# Patient Record
Sex: Female | Born: 1995 | Race: Asian | Hispanic: No | Marital: Married | Smoking: Never smoker
Health system: Southern US, Community
[De-identification: ages and names within clinical notes are randomized; demographics above are authoritative.]

## PROBLEM LIST (undated history)

## (undated) DIAGNOSIS — B999 Unspecified infectious disease: Secondary | ICD-10-CM

## (undated) DIAGNOSIS — F32A Depression, unspecified: Secondary | ICD-10-CM

## (undated) DIAGNOSIS — J45909 Unspecified asthma, uncomplicated: Secondary | ICD-10-CM

## (undated) HISTORY — PX: NO PAST SURGERIES: SHX2092

## (undated) HISTORY — PX: WISDOM TOOTH EXTRACTION: SHX21

---

## 2016-12-18 LAB — OB RESULTS CONSOLE RPR: RPR: NONREACTIVE

## 2016-12-18 LAB — OB RESULTS CONSOLE ABO/RH: RH Type: POSITIVE

## 2016-12-18 LAB — OB RESULTS CONSOLE RUBELLA ANTIBODY, IGM: RUBELLA: IMMUNE

## 2016-12-18 LAB — OB RESULTS CONSOLE HEPATITIS B SURFACE ANTIGEN: HEP B S AG: NEGATIVE

## 2016-12-18 LAB — OB RESULTS CONSOLE HIV ANTIBODY (ROUTINE TESTING): HIV: NONREACTIVE

## 2016-12-18 LAB — OB RESULTS CONSOLE ANTIBODY SCREEN: Antibody Screen: NEGATIVE

## 2016-12-18 LAB — OB RESULTS CONSOLE GC/CHLAMYDIA: Gonorrhea: NEGATIVE

## 2017-06-28 ENCOUNTER — Inpatient Hospital Stay (HOSPITAL_COMMUNITY)
Admit: 2017-06-28 | Discharge: 2017-07-01 | DRG: 786 | Disposition: A | Discharge status: Home / Self Care | Payer: Managed Care, Other (non HMO) | Source: Ambulatory Visit | Attending: Obstetrics and Gynecology | Admitting: Obstetrics and Gynecology

## 2017-06-28 ENCOUNTER — Encounter (HOSPITAL_COMMUNITY): Disposition: A | Discharge status: Home / Self Care | Payer: Self-pay | Source: Ambulatory Visit

## 2017-06-28 ENCOUNTER — Encounter (HOSPITAL_COMMUNITY): Payer: Self-pay | Admitting: *Deleted

## 2017-06-28 ENCOUNTER — Inpatient Hospital Stay (HOSPITAL_COMMUNITY): Payer: Managed Care, Other (non HMO) | Admitting: Anesthesiology

## 2017-06-28 DIAGNOSIS — O321XX0 Maternal care for breech presentation, not applicable or unspecified: Secondary | ICD-10-CM | POA: Clinically undetermined

## 2017-06-28 DIAGNOSIS — F329 Major depressive disorder, single episode, unspecified: Secondary | ICD-10-CM | POA: Diagnosis present

## 2017-06-28 DIAGNOSIS — Z3A36 36 weeks gestation of pregnancy: Secondary | ICD-10-CM | POA: Diagnosis not present

## 2017-06-28 DIAGNOSIS — F418 Other specified anxiety disorders: Secondary | ICD-10-CM | POA: Diagnosis present

## 2017-06-28 DIAGNOSIS — O42913 Preterm premature rupture of membranes, unspecified as to length of time between rupture and onset of labor, third trimester: Secondary | ICD-10-CM | POA: Diagnosis present

## 2017-06-28 DIAGNOSIS — O99344 Other mental disorders complicating childbirth: Secondary | ICD-10-CM | POA: Diagnosis present

## 2017-06-28 DIAGNOSIS — O42919 Preterm premature rupture of membranes, unspecified as to length of time between rupture and onset of labor, unspecified trimester: Secondary | ICD-10-CM | POA: Diagnosis present

## 2017-06-28 DIAGNOSIS — O9902 Anemia complicating childbirth: Secondary | ICD-10-CM | POA: Diagnosis present

## 2017-06-28 DIAGNOSIS — F419 Anxiety disorder, unspecified: Secondary | ICD-10-CM | POA: Diagnosis present

## 2017-06-28 DIAGNOSIS — O42013 Preterm premature rupture of membranes, onset of labor within 24 hours of rupture, third trimester: Secondary | ICD-10-CM | POA: Diagnosis present

## 2017-06-28 DIAGNOSIS — D649 Anemia, unspecified: Secondary | ICD-10-CM | POA: Diagnosis present

## 2017-06-28 HISTORY — DX: Unspecified asthma, uncomplicated: J45.909

## 2017-06-28 LAB — TYPE AND SCREEN
ABO/RH(D): O POS
Antibody Screen: NEGATIVE

## 2017-06-28 LAB — CBC
HEMATOCRIT: 35 % — AB (ref 36.0–46.0)
HEMOGLOBIN: 11.6 g/dL — AB (ref 12.0–15.0)
MCH: 29.7 pg (ref 26.0–34.0)
MCHC: 33.1 g/dL (ref 30.0–36.0)
MCV: 89.5 fL (ref 78.0–100.0)
Platelets: 161 10*3/uL (ref 150–400)
RBC: 3.91 MIL/uL (ref 3.87–5.11)
RDW: 14 % (ref 11.5–15.5)
WBC: 6.6 10*3/uL (ref 4.0–10.5)

## 2017-06-28 LAB — ABO/RH: ABO/RH(D): O POS

## 2017-06-28 LAB — GROUP B STREP BY PCR: GROUP B STREP BY PCR: NEGATIVE

## 2017-06-28 SURGERY — Surgical Case
Anesthesia: Spinal

## 2017-06-28 MED ORDER — FENTANYL CITRATE (PF) 100 MCG/2ML IJ SOLN
INTRAMUSCULAR | Status: DC | PRN
  Administered 2017-06-28: 20 ug via INTRATHECAL

## 2017-06-28 MED ORDER — LACTATED RINGERS IV SOLN: 500.0000 mL | INTRAVENOUS | Status: DC | PRN

## 2017-06-28 MED ORDER — SCOPOLAMINE 1 MG/3DAYS TD PT72
MEDICATED_PATCH | TRANSDERMAL | Status: AC
  Filled 2017-06-28: qty 1

## 2017-06-28 MED ORDER — ONDANSETRON HCL 4 MG/2ML IJ SOLN: 4.0000 mg | Freq: Four times a day (QID) | INTRAMUSCULAR | Status: DC | PRN

## 2017-06-28 MED ORDER — NALBUPHINE HCL 10 MG/ML IJ SOLN: 5.0000 mg | Freq: Once | INTRAMUSCULAR | Status: AC | PRN

## 2017-06-28 MED ORDER — NALBUPHINE HCL 10 MG/ML IJ SOLN
INTRAMUSCULAR | Status: AC
  Filled 2017-06-28: qty 1

## 2017-06-28 MED ORDER — CEFAZOLIN SODIUM-DEXTROSE 2-4 GM/100ML-% IV SOLN
INTRAVENOUS | Status: AC
  Filled 2017-06-28: qty 100

## 2017-06-28 MED ORDER — LIDOCAINE HCL (PF) 1 % IJ SOLN: 30.0000 mL | INTRAMUSCULAR | Status: DC | PRN

## 2017-06-28 MED ORDER — NALOXONE HCL 4 MG/10ML IJ SOLN
1.0000 ug/kg/h | INTRAVENOUS | Status: DC | PRN
  Filled 2017-06-28: qty 5

## 2017-06-28 MED ORDER — ONDANSETRON HCL 4 MG/2ML IJ SOLN
INTRAMUSCULAR | Status: DC | PRN
  Administered 2017-06-28: 4 mg via INTRAVENOUS

## 2017-06-28 MED ORDER — KETOROLAC TROMETHAMINE 30 MG/ML IJ SOLN: 30.0000 mg | Freq: Once | INTRAMUSCULAR | Status: AC | PRN

## 2017-06-28 MED ORDER — MEPERIDINE HCL 25 MG/ML IJ SOLN: 6.2500 mg | INTRAMUSCULAR | Status: DC | PRN

## 2017-06-28 MED ORDER — LACTATED RINGERS IV SOLN
INTRAVENOUS | Status: DC | PRN
  Administered 2017-06-28 (×2): via INTRAVENOUS

## 2017-06-28 MED ORDER — BUPIVACAINE HCL (PF) 0.25 % IJ SOLN
INTRAMUSCULAR | Status: AC
  Filled 2017-06-28: qty 10

## 2017-06-28 MED ORDER — STERILE WATER FOR IRRIGATION IR SOLN
Status: DC | PRN
  Administered 2017-06-28: 1000 mL

## 2017-06-28 MED ORDER — DEXAMETHASONE SODIUM PHOSPHATE 4 MG/ML IJ SOLN
INTRAMUSCULAR | Status: DC | PRN
  Administered 2017-06-28: 4 mg via INTRAVENOUS

## 2017-06-28 MED ORDER — LACTATED RINGERS IV BOLUS
1000.0000 mL | Freq: Once | INTRAVENOUS | Status: AC
  Administered 2017-06-28: 1000 mL via INTRAVENOUS

## 2017-06-28 MED ORDER — OXYCODONE-ACETAMINOPHEN 5-325 MG PO TABS: 2.0000 | ORAL_TABLET | ORAL | Status: DC | PRN

## 2017-06-28 MED ORDER — NALOXONE HCL 0.4 MG/ML IJ SOLN: 0.4000 mg | INTRAMUSCULAR | Status: DC | PRN

## 2017-06-28 MED ORDER — SCOPOLAMINE 1 MG/3DAYS TD PT72
MEDICATED_PATCH | TRANSDERMAL | Status: DC | PRN
  Administered 2017-06-28: 1 via TRANSDERMAL

## 2017-06-28 MED ORDER — SOD CITRATE-CITRIC ACID 500-334 MG/5ML PO SOLN: 30.0000 mL | Freq: Once | ORAL | Status: DC

## 2017-06-28 MED ORDER — DEXAMETHASONE SODIUM PHOSPHATE 4 MG/ML IJ SOLN
INTRAMUSCULAR | Status: AC
  Filled 2017-06-28: qty 1

## 2017-06-28 MED ORDER — FENTANYL CITRATE (PF) 100 MCG/2ML IJ SOLN
INTRAMUSCULAR | Status: AC
  Filled 2017-06-28: qty 2

## 2017-06-28 MED ORDER — DIPHENHYDRAMINE HCL 25 MG PO CAPS
25.0000 mg | ORAL_CAPSULE | ORAL | Status: DC | PRN
  Filled 2017-06-28: qty 1

## 2017-06-28 MED ORDER — ONDANSETRON HCL 4 MG/2ML IJ SOLN: 4.0000 mg | Freq: Three times a day (TID) | INTRAMUSCULAR | Status: DC | PRN

## 2017-06-28 MED ORDER — BETAMETHASONE SOD PHOS & ACET 6 (3-3) MG/ML IJ SUSP
12.0000 mg | INTRAMUSCULAR | Status: DC
  Administered 2017-06-28: 12 mg via INTRAMUSCULAR
  Filled 2017-06-28: qty 2

## 2017-06-28 MED ORDER — PROMETHAZINE HCL 25 MG/ML IJ SOLN: 6.2500 mg | INTRAMUSCULAR | Status: DC | PRN

## 2017-06-28 MED ORDER — OXYTOCIN 10 UNIT/ML IJ SOLN
INTRAMUSCULAR | Status: AC
  Filled 2017-06-28: qty 1

## 2017-06-28 MED ORDER — DIPHENHYDRAMINE HCL 50 MG/ML IJ SOLN: 12.5000 mg | INTRAMUSCULAR | Status: DC | PRN

## 2017-06-28 MED ORDER — SODIUM CHLORIDE 0.9 % IR SOLN
Status: DC | PRN
  Administered 2017-06-28: 1000 mL

## 2017-06-28 MED ORDER — KETOROLAC TROMETHAMINE 30 MG/ML IJ SOLN: 30.0000 mg | Freq: Four times a day (QID) | INTRAMUSCULAR | Status: AC | PRN

## 2017-06-28 MED ORDER — NALBUPHINE HCL 10 MG/ML IJ SOLN: 5.0000 mg | INTRAMUSCULAR | Status: DC | PRN

## 2017-06-28 MED ORDER — ACETAMINOPHEN 325 MG PO TABS: 650.0000 mg | ORAL_TABLET | ORAL | Status: DC | PRN

## 2017-06-28 MED ORDER — ACETAMINOPHEN 500 MG PO TABS
1000.0000 mg | ORAL_TABLET | Freq: Four times a day (QID) | ORAL | Status: AC
  Administered 2017-06-29 (×3): 1000 mg via ORAL
  Filled 2017-06-28 (×3): qty 2

## 2017-06-28 MED ORDER — SERTRALINE HCL 25 MG PO TABS
25.0000 mg | ORAL_TABLET | Freq: Every day | ORAL | Status: DC
  Administered 2017-06-28: 25 mg via ORAL
  Filled 2017-06-28 (×2): qty 1

## 2017-06-28 MED ORDER — CEFAZOLIN SODIUM-DEXTROSE 2-3 GM-%(50ML) IV SOLR
INTRAVENOUS | Status: DC | PRN
  Administered 2017-06-28: 2 g via INTRAVENOUS

## 2017-06-28 MED ORDER — MORPHINE SULFATE (PF) 0.5 MG/ML IJ SOLN
INTRAMUSCULAR | Status: AC
  Filled 2017-06-28: qty 10

## 2017-06-28 MED ORDER — NALBUPHINE HCL 10 MG/ML IJ SOLN
5.0000 mg | Freq: Once | INTRAMUSCULAR | Status: AC | PRN
  Administered 2017-06-28: 5 mg via INTRAVENOUS

## 2017-06-28 MED ORDER — ONDANSETRON HCL 4 MG/2ML IJ SOLN
INTRAMUSCULAR | Status: AC
  Filled 2017-06-28: qty 2

## 2017-06-28 MED ORDER — BUPIVACAINE IN DEXTROSE 0.75-8.25 % IT SOLN
INTRATHECAL | Status: DC | PRN
  Administered 2017-06-28: 1.4 mL via INTRATHECAL

## 2017-06-28 MED ORDER — OXYTOCIN 10 UNIT/ML IJ SOLN: 10.0000 [IU] | Freq: Once | INTRAMUSCULAR | Status: DC

## 2017-06-28 MED ORDER — NALBUPHINE HCL 10 MG/ML IJ SOLN
5.0000 mg | INTRAMUSCULAR | Status: DC | PRN
  Administered 2017-06-29 (×3): 5 mg via INTRAVENOUS
  Filled 2017-06-28 (×3): qty 1

## 2017-06-28 MED ORDER — OXYTOCIN 40 UNITS IN LACTATED RINGERS INFUSION - SIMPLE MED: 2.5000 [IU]/h | INTRAVENOUS | Status: DC

## 2017-06-28 MED ORDER — MORPHINE SULFATE (PF) 0.5 MG/ML IJ SOLN
INTRAMUSCULAR | Status: DC | PRN
  Administered 2017-06-28: .2 mg via INTRATHECAL

## 2017-06-28 MED ORDER — OXYTOCIN 40 UNITS IN LACTATED RINGERS INFUSION - SIMPLE MED
INTRAVENOUS | Status: AC
  Filled 2017-06-28: qty 1000

## 2017-06-28 MED ORDER — PHENYLEPHRINE 8 MG IN D5W 100 ML (0.08MG/ML) PREMIX OPTIME
INJECTION | INTRAVENOUS | Status: AC
  Filled 2017-06-28: qty 100

## 2017-06-28 MED ORDER — SCOPOLAMINE 1 MG/3DAYS TD PT72: 1.0000 | MEDICATED_PATCH | Freq: Once | TRANSDERMAL | Status: DC

## 2017-06-28 MED ORDER — OXYTOCIN BOLUS FROM INFUSION: 500.0000 mL | Freq: Once | INTRAVENOUS | Status: DC

## 2017-06-28 MED ORDER — HYDROMORPHONE HCL 1 MG/ML IJ SOLN: 0.2500 mg | INTRAMUSCULAR | Status: DC | PRN

## 2017-06-28 MED ORDER — OXYCODONE-ACETAMINOPHEN 5-325 MG PO TABS: 1.0000 | ORAL_TABLET | ORAL | Status: DC | PRN

## 2017-06-28 MED ORDER — SOD CITRATE-CITRIC ACID 500-334 MG/5ML PO SOLN
30.0000 mL | ORAL | Status: DC | PRN
  Filled 2017-06-28: qty 15

## 2017-06-28 MED ORDER — PHENYLEPHRINE 8 MG IN D5W 100 ML (0.08MG/ML) PREMIX OPTIME
INJECTION | INTRAVENOUS | Status: DC | PRN
  Administered 2017-06-28: 60 ug/min via INTRAVENOUS

## 2017-06-28 MED ORDER — SODIUM CHLORIDE 0.9% FLUSH: 3.0000 mL | INTRAVENOUS | Status: DC | PRN

## 2017-06-28 MED ORDER — BUPIVACAINE HCL (PF) 0.25 % IJ SOLN
INTRAMUSCULAR | Status: DC | PRN
  Administered 2017-06-28: 20 mL

## 2017-06-28 SURGICAL SUPPLY — 35 items
CHLORAPREP W/TINT 26ML (MISCELLANEOUS) ×3 IMPLANT
CLAMP CORD UMBIL (MISCELLANEOUS) IMPLANT
CLOTH BEACON ORANGE TIMEOUT ST (SAFETY) ×3 IMPLANT
DERMABOND ADVANCED (GAUZE/BANDAGES/DRESSINGS) ×2
DERMABOND ADVANCED .7 DNX12 (GAUZE/BANDAGES/DRESSINGS) ×1 IMPLANT
DRSG OPSITE POSTOP 4X10 (GAUZE/BANDAGES/DRESSINGS) ×3 IMPLANT
ELECT REM PT RETURN 9FT ADLT (ELECTROSURGICAL) ×3
ELECTRODE REM PT RTRN 9FT ADLT (ELECTROSURGICAL) ×1 IMPLANT
EXTRACTOR VACUUM M CUP 4 TUBE (SUCTIONS) IMPLANT
EXTRACTOR VACUUM M CUP 4' TUBE (SUCTIONS)
GLOVE BIO SURGEON STRL SZ7.5 (GLOVE) ×3 IMPLANT
GLOVE BIOGEL PI IND STRL 7.0 (GLOVE) ×1 IMPLANT
GLOVE BIOGEL PI INDICATOR 7.0 (GLOVE) ×2
GOWN STRL REUS W/TWL LRG LVL3 (GOWN DISPOSABLE) ×6 IMPLANT
KIT ABG SYR 3ML LUER SLIP (SYRINGE) IMPLANT
NEEDLE HYPO 22GX1.5 SAFETY (NEEDLE) ×3 IMPLANT
NEEDLE HYPO 25X5/8 SAFETYGLIDE (NEEDLE) IMPLANT
NEEDLE SPNL 20GX3.5 QUINCKE YW (NEEDLE) IMPLANT
NS IRRIG 1000ML POUR BTL (IV SOLUTION) ×3 IMPLANT
PACK C SECTION WH (CUSTOM PROCEDURE TRAY) ×3 IMPLANT
PENCIL SMOKE EVAC W/HOLSTER (ELECTROSURGICAL) ×3 IMPLANT
SUT MNCRL 0 VIOLET CTX 36 (SUTURE) ×2 IMPLANT
SUT MNCRL AB 3-0 PS2 27 (SUTURE) IMPLANT
SUT MON AB 2-0 CT1 27 (SUTURE) ×3 IMPLANT
SUT MON AB-0 CT1 36 (SUTURE) ×6 IMPLANT
SUT MONOCRYL 0 CTX 36 (SUTURE) ×4
SUT PLAIN 0 NONE (SUTURE) IMPLANT
SUT PLAIN 2 0 (SUTURE)
SUT PLAIN 2 0 XLH (SUTURE) IMPLANT
SUT PLAIN ABS 2-0 CT1 27XMFL (SUTURE) IMPLANT
SUT VIC AB 4-0 KS 27 (SUTURE) ×3 IMPLANT
SYR 20CC LL (SYRINGE) IMPLANT
SYR CONTROL 10ML LL (SYRINGE) ×3 IMPLANT
TOWEL OR 17X24 6PK STRL BLUE (TOWEL DISPOSABLE) ×3 IMPLANT
TRAY FOLEY W/BAG SLVR 14FR LF (SET/KITS/TRAYS/PACK) ×3 IMPLANT

## 2017-06-28 NOTE — Progress Notes (Signed)
S: Contractions stronger and regular this afternoon, some blood on pad noted, continues leaking fluid.  Patient decided she did want steroids injection for fetal lung maturation after all, and received one dose betamethasone this afternoon.   O: VSSAF FHR 140 intermittent, no audible decels Toco: q 5-10 min, mild/mod to palp  SVE 3-4/80/-1, sacrum presenting  Bedside sono - breech confirmed, fetal head in URQ, fluid subjectively normal  A/P: 36.1 wks, PPROM now in labor FHT cat 1 S/p BMZ inj #1 at 1930 Cervical change, progressing in labor Malpresentation  Reviewed risks of breech vaginal birth vs primary cesarean section. Patient desires to proceed with cesarean Risk of C/S reviewed including infection, bleeding, poss need for blood transfusion and its risk,( HIV, hepatitis, fever/rash) injury to surrounding organ structures, internal scar tissue. All questions answered.  Dr. Billy Coast updated.   Neta Mends, MSN, CNM 06/28/2017, 9:15 PM

## 2017-06-28 NOTE — Progress Notes (Signed)
Michelle Vanhise,RN PACU RN 

## 2017-06-28 NOTE — Progress Notes (Signed)
Patient seen and examined. Consent witnessed and signed. No changes noted. Update completed. Confirmed Breech with significant cervical change per CNM Will proceed with csection. Discussed with anesthesia.  Unable to wait appropriate time for NPO status due to Breech and cervical change. Tocolysis not advisable with PPROM and possible risks of infection. Patient ID: Adrienne Calderon, female   DOB: 09/07/1995, 22 y.o.   MRN: [id number]

## 2017-06-28 NOTE — Op Note (Signed)
Cesarean Section Procedure Note  Indications: malpresentation: frank breech, PPROM, active labor  Pre-operative Diagnosis: 36 week 1 day pregnancy.  Post-operative Diagnosis: same  Surgeon: Lenoard Aden   Assistants: Renae Fickle, CNM  Anesthesia: Local anesthesia 0.25.% bupivacaine and Spinal anesthesia  ASA Class: 2  Procedure Details  The patient was seen in the Holding Room. The risks, benefits, complications, treatment options, and expected outcomes were discussed with the patient.  The patient concurred with the proposed plan, giving informed consent. The risks of anesthesia, infection, bleeding and possible injury to other organs discussed. Injury to bowel, bladder, or ureter with possible need for repair discussed. Possible need for transfusion with secondary risks of hepatitis or HIV acquisition discussed. Post operative complications to include but not limited to DVT, PE and Pneumonia noted. The site of surgery properly noted/marked. The patient was taken to Operating Room # 9, identified as Adrienne Calderon and the procedure verified as C-Section Delivery. A Time Out was held and the above information confirmed.  After induction of anesthesia, the patient was draped and prepped in the usual sterile manner. A Pfannenstiel incision was made and carried down through the subcutaneous tissue to the fascia. Fascial incision was made and extended transversely using Mayo scissors. The fascia was separated from the underlying rectus tissue superiorly and inferiorly. The peritoneum was identified and entered. Peritoneal incision was extended longitudinally. The utero-vesical peritoneal reflection was incised transversely and the bladder flap was bluntly freed from the lower uterine segment. A low transverse uterine incision(Kerr hysterotomy) was made. Delivered from frank breech presentation was a  female with Apgar scores of 8 at one minute and 9 at five minutes. Bulb suctioning gently performed.  Neonatal team in attendance.After the umbilical cord was clamped and cut cord blood was obtained for evaluation. The placenta was removed intact and appeared normal. The uterus was curetted with a dry lap pack. Good hemostasis was noted.The uterine outline, tubes and ovaries appeared normal. The uterine incision was closed with running locked sutures of 0 Monocryl x 2 layers. Hemostasis was observed. Lavage was carried out until clear.The parietal peritoneum was closed with a running 2-0 Monocryl suture. The fascia was then reapproximated with running sutures of 0 Monocryl. The skin was reapproximated with 4-0 vicryl after Strawberry closure with 2-0 plain.  Instrument, sponge, and needle counts were correct prior the abdominal closure and at the conclusion of the case.   Findings: As noted , post placenta  Estimated Blood Loss:  500         Drains: foley                 Specimens: placenta                 Complications:  None; patient tolerated the procedure well.         Disposition: PACU - hemodynamically stable.         Condition: stable  Attending Attestation: I performed the procedure.

## 2017-06-28 NOTE — H&P (Signed)
OB ADMISSION/ HISTORY & PHYSICAL:  Admission Date: 06/28/2017  8:24 AM  Admit Diagnosis: Late preterm IUP with PROM at [redacted]w[redacted]d  Adrienne Calderon is a 22 y.o. female presenting for PPROM at [redacted]w[redacted]d. Noted gush of fluid at 2100 06/27/2017. + FM, occasional mild ctx, no fever/chills.   Prenatal History: G1P0   EDC : 07/22/2017, by Other Basis  Prenatal care at [facility] since [redacted] weeks gestation, TOC from [facility] (3 visits) for birth center care. Started prenatal care at [facility] for 1 visit.  Prenatal course complicated by depression, started on Zoloft 2 weeks ago with noted mood improvement, and now PPROM late term.  Prenatal Labs: ABO, Rh: O (10/18 0000)  Antibody: NEG (04/28 0956) Rubella: Immune (10/18 0000)  RPR: Nonreactive (10/18 0000)  HBsAg: Negative (10/18 0000)  HIV: Non-reactive (10/18 0000)  GBS:   unknown 1 hr Glucola : 126 Genetic screens: normal NT/ultrascreen, declined AFP Anatomy US wnl, unknown gender, posterior placenta   Medical / Surgical History :  Past medical history:  Past Medical History:  Diagnosis Date  . Asthma    childhood     Past surgical history:  Past Surgical History:  Procedure Laterality Date  . NO PAST SURGERIES    . WISDOM TOOTH EXTRACTION N/A      Family History:  Family History  Problem Relation Age of Onset  . Diabetes Maternal Grandmother      Social History:  reports that she has never smoked. She has never used smokeless tobacco. She reports that she drank alcohol. She reports that she does not use drugs.   Allergies: Benzoyl peroxide    Current Medications at time of admission:  Medications Prior to Admission  Medication Sig Dispense Refill Last Dose  . Prenatal Vit-Fe Fumarate-FA (PRENATAL MULTIVITAMIN) TABS tablet Take 1 tablet by mouth daily at 12 noon.   Past Week at Unknown time  . sertraline (ZOLOFT) 25 MG tablet Take 25 mg by mouth daily.   06/27/2017 at Unknown time      Review of  Systems: ROS As noted above   Physical Exam:  Exam by:: Renae Fickle, CNM Vitals:   06/28/17 1315 06/28/17 1316  BP:  (!) 103/46  Pulse:  91  Resp: 16   Temp: 98.3 F (36.8 C)   SpO2:      General: AAO x3, NAD Heart:RRR Lungs:CTAB Abdomen:gravid, NT, S=D Extremities:no edema Genitalia / VE:SSE positive for pooling, clear fluid, copious amount, cervix appears closed, patulous FHR: 140, mod var, + accels, no decels TOCO: occasional, mild  Labs:    Recent Labs    06/28/17 0956  WBC 6.6  HGB 11.6*  HCT 35.0*  PLT 161       Assessment:  22 y.o. G1P0 at [redacted]w[redacted]d, PPROM x 12 hrs  1. NIL, afebrile 2. FHR category 1 3. GBS pending, rapid 4. Desires low intervention birth 5. Depression/anxiety, stable on Zoloft 6. Breastfeeding  Plan:  1. Admit to BS 2. Routine L&D orders 3. Analgesia/anesthesia PRN  4. Discussed with patient option for 1) IOL (recommended d/t decreased risk infection/morbidity and no increased risk of cesarean section); 2) Expectant management for labor with option for AN steroids course to aid fetal lung maturation, some data shows benefit for late preterm infant in decreasing respiratory distress at birth. This however, may increase risk for chorio/endometritis. After answering all questions, patient desires to await physiologic labor for total 24 hrs, and agrees to IOL at 2100 tonight if no spontaneous labor  by then. Declines BMZ course.   5. Anticipate NSVB   Dr Billy Coast notified of admission / plan of care   Neta Mends CNM, MSN 06/28/2017, 3:21 PM

## 2017-06-28 NOTE — Transfer of Care (Signed)
Immediate Anesthesia Transfer of Care Note  Patient: Adrienne Calderon  Procedure(s) Performed: CESAREAN SECTION (N/A )  Patient Location: PACU  Anesthesia Type:Spinal  Level of Consciousness: awake  Airway & Oxygen Therapy: Patient Spontanous Breathing  Post-op Assessment: Report given to RN and Post -op Vital signs reviewed and stable  Post vital signs: Reviewed and stable  Last Vitals:  Vitals Value Taken Time  BP 108/88 06/28/2017 10:47 PM  Temp    Pulse 80 06/28/2017 10:50 PM  Resp 16 06/28/2017 10:50 PM  SpO2 99 % 06/28/2017 10:50 PM  Vitals shown include unvalidated device data.  Last Pain:  Vitals:   06/28/17 2011  TempSrc:   PainSc: 3          Complications: No apparent anesthesia complications

## 2017-06-28 NOTE — Anesthesia Preprocedure Evaluation (Signed)
Anesthesia Evaluation  Patient identified by MRN, date of birth, ID band Patient awake    Reviewed: Allergy & Precautions, H&P , NPO status , Patient's Chart, lab work & pertinent test results  Airway Mallampati: I  TM Distance: >3 FB Neck ROM: full    Dental  (+) Poor Dentition, Missing   Pulmonary    Pulmonary exam normal breath sounds clear to auscultation       Cardiovascular negative cardio ROS Normal cardiovascular exam Rhythm:regular Rate:Normal     Neuro/Psych negative neurological ROS  negative psych ROS   GI/Hepatic negative GI ROS, Neg liver ROS,   Endo/Other  negative endocrine ROS  Renal/GU negative Renal ROS  negative genitourinary   Musculoskeletal negative musculoskeletal ROS (+)   Abdominal (+) + obese,   Peds  Hematology  (+) Blood dyscrasia, anemia ,   Anesthesia Other Findings   Reproductive/Obstetrics (+) Pregnancy                             Anesthesia Physical Anesthesia Plan  ASA: II and emergent  Anesthesia Plan: Spinal   Post-op Pain Management:    Induction:   PONV Risk Score and Plan: 3 and Ondansetron, Dexamethasone and Scopolamine patch - Pre-op  Airway Management Planned: Natural Airway and Nasal Cannula  Additional Equipment:   Intra-op Plan:   Post-operative Plan:   Informed Consent: I have reviewed the patients History and Physical, chart, labs and discussed the procedure including the risks, benefits and alternatives for the proposed anesthesia with the patient or authorized representative who has indicated his/her understanding and acceptance.     Plan Discussed with: CRNA and Surgeon  Anesthesia Plan Comments:         Anesthesia Quick Evaluation

## 2017-06-28 NOTE — Progress Notes (Signed)
Adrienne Calderon is a 22 y.o. G1P0 at [redacted]w[redacted]d by ultrasound admitted for [facility] 2100 06/27/2017  Subjective: Resting in bed, reports ctx have dissipated. Would like to try nipple stim for endogenous oxytocin.  Objective: Vitals:   06/28/17 0910 06/28/17 0926 06/28/17 1315 06/28/17 1316  BP:  116/63  (!) 103/46  Pulse:  87  91  Resp:  18 16   Temp:  98.6 F (37 C) 98.3 F (36.8 C)   TempSrc:  Oral Oral   SpO2:  100%    Weight: 94.8 kg (209 lb)     Height:  (1.676 m)        FHT:  130-140 intermittent via Doppler, no audible decels UC:   rare SVE:   deferred  Labs:   Recent Labs    06/28/17 0956  WBC 6.6  HGB 11.6*  HCT 35.0*  PLT 161    Assessment / Plan: G1 36.1 wks, PPROM x 18 hrs, afebrile  Labor: NIL, plan nipple stim now w/ DEBP, oral miso 50 mcg at 2100 Preeclampsia:  no signs or symptoms of toxicity Fetal Wellbeing:  Category I Pain Control:  Labor support without medications and considering Nitrous Oxide for labor I/D:  Rapid GBS neg Anticipated MOD:  NSVD  Neta Mends, CNM, MSN 06/28/2017, 3:41 PM

## 2017-06-28 NOTE — Anesthesia Procedure Notes (Signed)
Spinal  Patient location during procedure: OR Start time: 06/28/2017 9:38 PM End time: 06/28/2017 9:41 PM Staffing Anesthesiologist: Leilani Able, MD Performed: anesthesiologist  Preanesthetic Checklist Completed: patient identified, site marked, surgical consent, pre-op evaluation, timeout performed, IV checked, risks and benefits discussed and monitors and equipment checked Spinal Block Patient position: sitting Prep: site prepped and draped and DuraPrep Patient monitoring: continuous pulse ox and blood pressure Location: L3-4 Injection technique: single-shot Needle Needle type: Pencan  Needle length: 10 cm Needle insertion depth: 6 cm Assessment Sensory level: T4

## 2017-06-29 ENCOUNTER — Other Ambulatory Visit: Payer: Self-pay

## 2017-06-29 ENCOUNTER — Encounter (HOSPITAL_COMMUNITY): Payer: Self-pay | Admitting: Obstetrics and Gynecology

## 2017-06-29 DIAGNOSIS — F418 Other specified anxiety disorders: Secondary | ICD-10-CM | POA: Diagnosis present

## 2017-06-29 LAB — CBC
HCT: 33.5 % — ABNORMAL LOW (ref 36.0–46.0)
HEMOGLOBIN: 11 g/dL — AB (ref 12.0–15.0)
MCH: 29.2 pg (ref 26.0–34.0)
MCHC: 32.8 g/dL (ref 30.0–36.0)
MCV: 88.9 fL (ref 78.0–100.0)
PLATELETS: 147 10*3/uL — AB (ref 150–400)
RBC: 3.77 MIL/uL — ABNORMAL LOW (ref 3.87–5.11)
RDW: 13.9 % (ref 11.5–15.5)
WBC: 12.6 10*3/uL — ABNORMAL HIGH (ref 4.0–10.5)

## 2017-06-29 LAB — RPR: RPR: NONREACTIVE

## 2017-06-29 MED ORDER — SENNOSIDES-DOCUSATE SODIUM 8.6-50 MG PO TABS
2.0000 | ORAL_TABLET | ORAL | Status: DC
  Administered 2017-06-30 (×2): 2 via ORAL
  Filled 2017-06-29 (×2): qty 2

## 2017-06-29 MED ORDER — SERTRALINE HCL 25 MG PO TABS
25.0000 mg | ORAL_TABLET | Freq: Every day | ORAL | Status: DC
  Administered 2017-06-29 – 2017-07-01 (×3): 25 mg via ORAL
  Filled 2017-06-29 (×3): qty 1

## 2017-06-29 MED ORDER — SIMETHICONE 80 MG PO CHEW
80.0000 mg | CHEWABLE_TABLET | Freq: Three times a day (TID) | ORAL | Status: DC
  Administered 2017-06-29 – 2017-07-01 (×5): 80 mg via ORAL
  Filled 2017-06-29 (×6): qty 1

## 2017-06-29 MED ORDER — ACETAMINOPHEN 325 MG PO TABS: 650.0000 mg | ORAL_TABLET | ORAL | Status: DC | PRN

## 2017-06-29 MED ORDER — OXYCODONE-ACETAMINOPHEN 5-325 MG PO TABS
1.0000 | ORAL_TABLET | ORAL | Status: DC | PRN
  Administered 2017-06-30 (×2): 1 via ORAL
  Filled 2017-06-29 (×2): qty 1

## 2017-06-29 MED ORDER — IBUPROFEN 600 MG PO TABS
600.0000 mg | ORAL_TABLET | Freq: Four times a day (QID) | ORAL | Status: DC
  Administered 2017-06-29 – 2017-07-01 (×8): 600 mg via ORAL
  Filled 2017-06-29 (×9): qty 1

## 2017-06-29 MED ORDER — ZOLPIDEM TARTRATE 5 MG PO TABS: 5.0000 mg | ORAL_TABLET | Freq: Every evening | ORAL | Status: DC | PRN

## 2017-06-29 MED ORDER — OXYTOCIN 40 UNITS IN LACTATED RINGERS INFUSION - SIMPLE MED
INTRAVENOUS | Status: AC
  Filled 2017-06-29: qty 1000

## 2017-06-29 MED ORDER — COCONUT OIL OIL: 1.0000 "application " | TOPICAL_OIL | Status: DC | PRN

## 2017-06-29 MED ORDER — WITCH HAZEL-GLYCERIN EX PADS: 1.0000 "application " | MEDICATED_PAD | CUTANEOUS | Status: DC | PRN

## 2017-06-29 MED ORDER — OXYTOCIN 40 UNITS IN LACTATED RINGERS INFUSION - SIMPLE MED
2.5000 [IU]/h | INTRAVENOUS | Status: AC
  Administered 2017-06-29: 2.5 [IU]/h via INTRAVENOUS

## 2017-06-29 MED ORDER — OXYCODONE-ACETAMINOPHEN 5-325 MG PO TABS
2.0000 | ORAL_TABLET | ORAL | Status: DC | PRN
  Filled 2017-06-29: qty 2

## 2017-06-29 MED ORDER — LACTATED RINGERS IV SOLN
INTRAVENOUS | Status: DC
  Administered 2017-06-29: 12:00:00 via INTRAVENOUS

## 2017-06-29 MED ORDER — PRENATAL MULTIVITAMIN CH
1.0000 | ORAL_TABLET | Freq: Every day | ORAL | Status: DC
  Administered 2017-06-29 – 2017-06-30 (×2): 1 via ORAL
  Filled 2017-06-29 (×2): qty 1

## 2017-06-29 MED ORDER — OXYTOCIN 10 UNIT/ML IJ SOLN
INTRAVENOUS | Status: DC | PRN
  Administered 2017-06-28: 40 [IU] via INTRAVENOUS

## 2017-06-29 MED ORDER — TETANUS-DIPHTH-ACELL PERTUSSIS 5-2.5-18.5 LF-MCG/0.5 IM SUSP: 0.5000 mL | Freq: Once | INTRAMUSCULAR | Status: DC

## 2017-06-29 MED ORDER — DIPHENHYDRAMINE HCL 25 MG PO CAPS: 25.0000 mg | ORAL_CAPSULE | Freq: Four times a day (QID) | ORAL | Status: DC | PRN

## 2017-06-29 MED ORDER — METHYLERGONOVINE MALEATE 0.2 MG PO TABS: 0.2000 mg | ORAL_TABLET | ORAL | Status: DC | PRN

## 2017-06-29 MED ORDER — SIMETHICONE 80 MG PO CHEW: 80.0000 mg | CHEWABLE_TABLET | ORAL | Status: DC | PRN

## 2017-06-29 MED ORDER — METHYLERGONOVINE MALEATE 0.2 MG/ML IJ SOLN: 0.2000 mg | INTRAMUSCULAR | Status: DC | PRN

## 2017-06-29 MED ORDER — MENTHOL 3 MG MT LOZG
1.0000 | LOZENGE | OROMUCOSAL | Status: DC | PRN
Start: 2017-06-29 — End: 2017-07-01

## 2017-06-29 MED ORDER — DIBUCAINE 1 % RE OINT: 1.0000 "application " | TOPICAL_OINTMENT | RECTAL | Status: DC | PRN

## 2017-06-29 MED ORDER — SIMETHICONE 80 MG PO CHEW
80.0000 mg | CHEWABLE_TABLET | ORAL | Status: DC
  Administered 2017-06-30 (×2): 80 mg via ORAL
  Filled 2017-06-29 (×2): qty 1

## 2017-06-29 NOTE — Anesthesia Postprocedure Evaluation (Signed)
Anesthesia Post Note  Patient: Adrienne Calderon  Procedure(s) Performed: CESAREAN SECTION (N/A )     Patient location during evaluation: Mother Baby Anesthesia Type: Spinal Level of consciousness: awake and alert and oriented Pain management: satisfactory to patient Vital Signs Assessment: post-procedure vital signs reviewed and stable Respiratory status: respiratory function stable and spontaneous breathing Cardiovascular status: blood pressure returned to baseline Postop Assessment: no headache, no backache, spinal receding, patient able to bend at knees and adequate PO intake Anesthetic complications: no    Last Vitals:  Vitals:   06/29/17 0320 06/29/17 0552  BP: 108/61   Pulse: (!) 58   Resp: 16 16  Temp: 36.7 C   SpO2: 98% 98%    Last Pain:  Vitals:   06/29/17 0542  TempSrc:   PainSc: 0-No pain   Pain Goal:                 Jaymar Loeber

## 2017-06-29 NOTE — Anesthesia Postprocedure Evaluation (Signed)
Anesthesia Post Note  Patient: Adrienne Calderon  Procedure(s) Performed: CESAREAN SECTION (N/A )     Patient location during evaluation: PACU Anesthesia Type: Spinal Level of consciousness: awake Pain management: pain level controlled Vital Signs Assessment: post-procedure vital signs reviewed and stable Respiratory status: spontaneous breathing Cardiovascular status: stable Postop Assessment: no headache, no backache, spinal receding, patient able to bend at knees and no apparent nausea or vomiting Anesthetic complications: no    Last Vitals:  Vitals:   06/28/17 2345 06/29/17 0005  BP: 111/67 116/61  Pulse: 78 75  Resp: 15 16  Temp: 37.3 C 36.9 C  SpO2: 97% 97%    Last Pain:  Vitals:   06/29/17 0005  TempSrc: Oral  PainSc: 0-No pain   Pain Goal:                 Lathen Seal JR,JOHN Maribeth Jiles

## 2017-06-29 NOTE — Progress Notes (Signed)
MOB was referred for history of depression/anxiety. * Referral screened out by Clinical Social Worker because none of the following criteria appear to apply: ~ History of anxiety/depression during this pregnancy, or of post-partum depression. ~ Diagnosis of anxiety and/or depression within last 3 years OR * MOB's symptoms currently being treated with medication and/or therapy. Please contact the Clinical Social Worker if needs arise, by MOB request, or if MOB scores greater than 9/yes to question 10 on Edinburgh Postpartum Depression Screen.  MOB has Rx for Zoloft. 

## 2017-06-29 NOTE — Lactation Note (Signed)
This note was copied from a baby's chart. Lactation Consultation Note  Patient Name: Adrienne Calderon [id number] Date: 06/29/2017 Reason for consult: Follow-up assessment;Late-preterm 34-36.6wks Reviewed late preterm feeding behaviors.  Discussed with mom that breastfeeding may take some time to learn and baby may need to be closer to term to transfer enough milk.  Stressed importance of pumping and hand expressing every 2-3 hours and giving any expressed milk to baby.  Recommended additional formula every 3 hours but mom desires to exclusively provide breastmilk.  She states she gave 10 mls by spoon a couple of hours ago.  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 Encouraged to call out for assist prn.  Maternal Data    Feeding Feeding Type: Breast Fed Length of feed: 10 min(on and off)  LATCH Score Latch: Too sleepy or reluctant, no latch achieved, no sucking elicited.  Audible Swallowing: None  Type of Nipple: Everted at rest and after stimulation  Comfort (Breast/Nipple): Soft / non-tender  Hold (Positioning): Assistance needed to correctly position infant at breast and maintain latch.  LATCH Score: 5  Interventions    Lactation Tools Discussed/Used Tools: Nipple Shields Nipple shield size: 16 Breast pump type: Double-Electric Breast Pump   Consult Status Consult Status: Follow-up Date: 06/30/17 Follow-up type: In-patient    Huston Foley 06/29/2017, 10:05 AM

## 2017-06-29 NOTE — Lactation Note (Signed)
This note was copied from a baby's chart. Lactation Consultation Note  Patient Name: Adrienne Calderon [id number] Date: 06/29/2017 Reason for consult: Follow-up assessment;Late-preterm 34-36.6wks  Infant seemed hungry & previous latch scores did not demonstrate milk transfer. Lauren, RN felt that the most infant was getting with supplementing was 1-2 mLs/at a time. A conversation was had with Mom & Dad about need for supplementing. Mom said that she preferred to use milk from a friend before using formula. Mom & Dad began contacting trusted friends to obtain pumped breast milk. Mom had been putting drops of expressed breast milk in infant's mouth with her finger. Infant seemed quite hungry & Mom consented to using formula until her friend's milk arrived. Milk sharing consent form was signed.   The formula was initially finger-fed, but infant did not always keep a seal around Dad's finger. "Adrienne Calderon" was cup-fed with relative ease.   Size 20 nipple shield instead of size 16 nipple shield provided b/c of increased pliability. Supplementing at breast was mentioned as a possibility for tomorrow. LPI volume parameters based on day of life reviewed, but emphasized increasing volume if "Adrienne Calderon" showed that she desired more.  Lurline Hare [facility] 06/29/2017, 11:34 PM

## 2017-06-29 NOTE — Lactation Note (Signed)
This note was copied from a baby's chart. Lactation Consultation Note  Patient Name: Adrienne Calderon [id number] Date: 06/29/2017 Reason for consult: Initial assessment;1st time breastfeeding;Primapara;Late-preterm 15-36.6wks  G1P1 mother whose infant is now 39 hours old.  Mother was trying to latch infant on right breast in football hold as LC entered the room.  This is a LPTI at 36+1 weeks and 6 lbs-5.4 oz.  Mother's breasts are soft and non tender.  She has small, short shaft nipples bilaterally.  Attempted to latch infant in football hold to the right breast without success.  Infant has a small mouth and recessed chin.  She cries without opening her lips wide and her tongue is tight to the upper palate when sucking on a gloved finger.  LC suggested a NS and mother agreed.  #16 NS in place and infant able to latch and suck.  Mother stated she could feel a tug but it was not painful.  Infant had to be stimulated to continue sucking.  LPTI policy reviewed with mother.  She prefers to use her EBM for supplementation and  DEBP set up with instructions for use and cleaning.  After pumping mother was not able to obtain any colostrum.  Reassured her this was normal.  Taught and reviewed breast massage and hand expression.  Infant fed 5 mls Sim 22 with curved tip syringe easily and fell asleep immediately.    Encouraged mother to pump every 3 hours and to save any EBM she may obtain.  Small container provided for colostrum.  Instructed mother to feed 8-12 times/24 hours or more if infant shows cues, how to awaken a sleepy baby and to put baby STS as much as possible while awake.  Mother verbalized understanding of instructions and will call RN for help as needed.  Mom made aware of O/P services, breastfeeding support groups, community resources, and our phone # for post-discharge questions.  Maternal Data Formula Feeding for Exclusion: No Has patient been taught Hand Expression?: Yes Does the patient have  breastfeeding experience prior to this delivery?: No  Feeding Feeding Type: Breast Fed Length of feed: 10 min(on/off with nipple shield)  LATCH Score Latch: Repeated attempts needed to sustain latch, nipple held in mouth throughout feeding, stimulation needed to elicit sucking reflex.  Audible Swallowing: None  Type of Nipple: Everted at rest and after stimulation(short shaft)  Comfort (Breast/Nipple): Soft / non-tender  Hold (Positioning): Assistance needed to correctly position infant at breast and maintain latch.  LATCH Score: 6  Interventions Interventions: Breast feeding basics reviewed;Assisted with latch;Skin to skin;Breast massage;Hand express;Position options;Support pillows;Adjust position;Breast compression;DEBP  Lactation Tools Discussed/Used Tools: Pump;Nipple Shields Nipple shield size: 16 Breast pump type: Double-Electric Breast Pump Pump Review: Setup, frequency, and cleaning;Milk Storage Initiated by:: Adrienne Calderon Date initiated:: 06/29/17   Consult Status Consult Status: Follow-up Date: 06/30/17 Follow-up type: In-patient    Adrienne Calderon R Adrienne Calderon 06/29/2017, 2:22 AM

## 2017-06-29 NOTE — Addendum Note (Signed)
Addendum  created 06/29/17 0839 by Graciela Husbands, CRNA   Sign clinical note

## 2017-06-29 NOTE — Progress Notes (Signed)
Subjective: POD# 1 Information for the patient's newborn:  [place], [place] Girl Dorie [[id number]]  female  Baby name: Remonia Richter  Reports feeling very well, sitting up in bed and eating breakfast with family. Feeding: breast Patient reports tolerating PO.  Breast symptoms: difficult latch, using NS Pain controlled with PO meds, APAP, NSAID Denies HA/SOB/C/P/N/V/dizziness. Flatus present. She reports vaginal bleeding as normal, without clots.  She is ambulating, foley cath still in place.     Objective:   VS:    Vitals:   06/29/17 0100 06/29/17 0220 06/29/17 0320 06/29/17 0552  BP: (!) 111/55 (!) 111/59 108/61   Pulse: 80 61 (!) 58   Resp: Temp: 98.2 F (36.8 C) 97.8 F (36.6 C) 98 F (36.7 C)   TempSrc: Oral Oral Oral   SpO2: 97% 98% 98% 98%  Weight:      Height:          Intake/Output Summary (Last 24 hours) at 06/29/2017 0813 Last data filed at 06/29/2017 0552 Gross per 24 hour  Intake 1900 ml  Output 2702 ml  Net -802 ml        Recent Labs    06/28/17 0956 06/29/17 0538  WBC 6.6 12.6*  HGB 11.6* 11.0*  HCT 35.0* 33.5*  PLT 161 147*     Blood type: --/--/O POS, O POS Performed at [facility], [address] [place], [place] [address]  [phone])  Rubella: Immune (10/18 0000)     Physical Exam:  General: alert, cooperative and no distress CV: Regular rate and rhythm Resp: clear Abdomen: soft, nontender, normal bowel sounds Incision: clean, dry and intact Uterine Fundus: firm, below umbilicus, nontender Lochia: minimal Ext: extremities normal, atraumatic, no cyanosis or edema      Assessment/Plan: 22 y.o.   POD# 1. G1P0                  Principal Problem:   Cesarean delivery delivered 4/28 Active Problems:   Preterm premature rupture of membranes (PPROM) with unknown onset of labor   Breech presentation   Postpartum care following cesarean delivery   Depression with anxiety  - stable, continue Zoloft 25 mg daily  Doing well,  stable.               Advance diet as tolerated Encourage rest when baby rests Breastfeeding support Encourage to ambulate Routine post-op care  Neta Mends, CNM, MSN 06/29/2017, 8:13 AM

## 2017-06-30 NOTE — Lactation Note (Signed)
This note was copied from a baby's chart. Lactation Consultation Note  Patient Name: Adrienne Calderon Today's Date: 06/30/2017   Infant was supplemented at the breast w/5 Fr & syringe. Size 20 nipple shield was used; infant was able to drink 34mL in 8 minutes.  Mom still comfortable with size 21 flanges when pumping. Mom's breasts are filling. Mom has pumped a few times today. I asked her to try and pump every few hours to prevent potential engorgement tomorrow.   Parents do think that infant has stooled within the last 24 hours; they think there may have been a stool around 6am today that was not properly marked on their yellow sheet.   Lurline Hare [facility] 06/30/2017, 9:46 PM

## 2017-06-30 NOTE — Progress Notes (Signed)
Subjective: POD# 2 Information for the patient's newborn:  Adrienne, Garno Girl Calderon [[id number]]  female  Baby name: Adrienne Calderon  Reports feeling mores sore today Feeding: breast Patient reports tolerating PO.  Breast symptoms: baby sleepy at breast, needs help with latch, now using supplemental cup feedings with donor breast milk Pain controlled with PO meds Denies HA/SOB/C/P/N/V/dizziness. Flatus present. She reports vaginal bleeding as normal, without clots.  She is ambulating, urinating without difficulty.     Objective:   VS:    Vitals:   06/29/17 1545 06/29/17 2000 06/30/17 0059 06/30/17 0552  BP: (!) 101/51 117/62 (!) 99/56 (!) 106/58  Pulse: (!) 52 60 (!) 58 (!) 55  Resp: Temp: 98.6 F (37 C) 98.2 F (36.8 C) 98.3 F (36.8 C) 97.7 F (36.5 C)  TempSrc: Oral Oral Oral Oral  SpO2: 96% 100% 97%   Weight:      Height:          Intake/Output Summary (Last 24 hours) at 06/30/2017 1051 Last data filed at 06/29/2017 2000 Gross per 24 hour  Intake 1225 ml  Output 1725 ml  Net -500 ml        Recent Labs    06/28/17 0956 06/29/17 0538  WBC 6.6 12.6*  HGB 11.6* 11.0*  HCT 35.0* 33.5*  PLT 161 147*     Blood type: --/--/O POS, O POS Performed at [facility], [address] [place], [place] [address]  [phone])  Rubella: Immune (10/18 0000)     Physical Exam:  General: alert, cooperative and no distress Abdomen: soft, nontender, normal bowel sounds Incision: dry, intact and small amt serous drainage Uterine Fundus: firm, below umbilicus, nontender Lochia: minimal Ext: no edema, redness or tenderness in the calves or thighs      Assessment/Plan: 22 y.o.   POD# 2. G1P0                  Principal Problem:   Cesarean delivery delivered 4/28 Active Problems:   Preterm premature rupture of membranes (PPROM) with unknown onset of labor   Breech presentation   Postpartum care following cesarean delivery   Depression with anxiety  - stable,  continue on Zoloft 25 mg daily  Doing well, stable.    Breastfeeding support - S&S system setup today Encourage to ambulate Routine post-op care Anticipate DC tomorrow  Adrienne Calderon, CNM, MSN 06/30/2017, 10:51 AM

## 2017-06-30 NOTE — Lactation Note (Signed)
This note was copied from a baby's chart. Lactation Consultation Note  Patient Name: Adrienne Calderon GEXBM'[id number] Date: 06/30/2017 Reason for consult: Follow-up assessment;Late-preterm 34-36.6wks Baby has been cupfeeding and mom would like to try to supplement at the breast.  Mom has short nipples.  Baby does not open mouth well.  Unable to latch so 16 mm nipple shield applied with a 5 french feeding tube and syringe.  Baby latched well after a few attempts and took 9 mls.  Finger fed another 2 mls.  Baby tolerated well.  Mom has pumped once this morning.  Stressed importance of pumping every 3 hours.  Instructed to call out for assist prn.  Maternal Data    Feeding Feeding Type: Donor Breast Milk  LATCH Score Latch: Repeated attempts needed to sustain latch, nipple held in mouth throughout feeding, stimulation needed to elicit sucking reflex.  Audible Swallowing: Spontaneous and intermittent  Type of Nipple: Everted at rest and after stimulation(short)  Comfort (Breast/Nipple): Soft / non-tender  Hold (Positioning): Assistance needed to correctly position infant at breast and maintain latch.  LATCH Score: 8  Interventions Interventions: Assisted with latch  Lactation Tools Discussed/Used Tools: Nipple Shields Nipple shield size: 16   Consult Status Consult Status: Follow-up Date: 07/01/17 Follow-up type: In-patient    Huston Foley 06/30/2017, 2:30 PM

## 2017-07-01 ENCOUNTER — Encounter (HOSPITAL_COMMUNITY): Payer: Self-pay | Admitting: *Deleted

## 2017-07-01 MED ORDER — COCONUT OIL OIL: 1.0000 "application " | TOPICAL_OIL | 0 refills | Status: DC | PRN

## 2017-07-01 MED ORDER — OXYCODONE-ACETAMINOPHEN 5-325 MG PO TABS: 1.0000 | ORAL_TABLET | ORAL | 0 refills | Status: DC | PRN

## 2017-07-01 MED ORDER — SENNOSIDES-DOCUSATE SODIUM 8.6-50 MG PO TABS: 2.0000 | ORAL_TABLET | ORAL | Status: DC

## 2017-07-01 MED ORDER — ACETAMINOPHEN 325 MG PO TABS: 650.0000 mg | ORAL_TABLET | ORAL | Status: DC | PRN

## 2017-07-01 MED ORDER — SIMETHICONE 80 MG PO CHEW: 80.0000 mg | CHEWABLE_TABLET | Freq: Three times a day (TID) | ORAL | 0 refills | Status: DC

## 2017-07-01 MED ORDER — IBUPROFEN 600 MG PO TABS: 600.0000 mg | ORAL_TABLET | Freq: Four times a day (QID) | ORAL | 0 refills | Status: DC

## 2017-07-01 NOTE — Discharge Summary (Signed)
OB Discharge Summary     Patient Name: Adrienne Calderon DOB: 04-02-1995 MRN: [id number]  Date of admission: 06/28/2017 Delivering MD: Olivia Mackie   Date of discharge: 07/01/2017  Admitting diagnosis: 36 wks direct admit ruptured Intrauterine pregnancy: [redacted]w[redacted]d     Secondary diagnosis:  Principal Problem:   Cesarean delivery delivered 4/28 Active Problems:   Preterm premature rupture of membranes (PPROM) with unknown onset of labor   Breech presentation   Postpartum care following cesarean delivery   Depression with anxiety     Discharge diagnosis:  Patient Active Problem List   Diagnosis Date Noted  . Cesarean delivery delivered 4/28 06/29/2017  . Postpartum care following cesarean delivery 06/29/2017  . Depression with anxiety 06/29/2017  . Preterm premature rupture of membranes (PPROM) with unknown onset of labor 06/28/2017  . Breech presentation 06/28/2017                                                                                                 Post partum procedures:none  Complications: None  Hospital course:  Onset of Labor With Unplanned C/S  22 y.o. yo G1P0 at [redacted]w[redacted]d was admitted in Latent Labor on 06/28/2017. Patient had a labor course significant for premature preterm rupture of membranes and breech presentation. Membrane Rupture Time/Date: 9:00 PM ,06/27/2017   The patient went for cesarean section due to Malpresentation, and delivered a Viable infant,06/28/2017  Details of operation can be found in separate operative note. Patient had an uncomplicated postpartum course.  She is ambulating,tolerating a regular diet, passing flatus, and urinating well.  Patient is discharged home in stable condition 07/01/17.  Physical exam  Vitals:   06/30/17 0552 06/30/17 1712 07/01/17 0604 07/01/17 0714  BP: (!) 106/58 115/69 (!) 81/51 103/80  Pulse: (!) 55 83 77 75  Resp: Temp: 97.7 F (36.5 C) 98.6 F (37 C)  98.2 F (36.8 C)  TempSrc: Oral Oral  Oral  SpO2:   100%  99%  Weight:      Height:       General: alert, cooperative and no distress Lochia: appropriate Uterine Fundus: firm Incision: Healing well with no significant drainage DVT Evaluation: No cords or calf tenderness. No significant calf/ankle edema. Labs: Lab Results  Component Value Date   WBC 12.6 (H) 06/29/2017   HGB 11.0 (L) 06/29/2017   HCT 33.5 (L) 06/29/2017   MCV 88.9 06/29/2017   PLT 147 (L) 06/29/2017   No flowsheet data found.  Discharge instruction: per After Visit Summary and "Baby and Me Booklet".  After visit meds:  Allergies as of 07/01/2017      Reactions   Benzoyl Peroxide Hives      Medication List    TAKE these medications   acetaminophen 325 MG tablet Commonly known as:  TYLENOL Take 2 tablets (650 mg total) by mouth every 4 (four) hours as needed (for pain scale < 4).   coconut oil Oil Apply 1 application topically as needed.   ibuprofen 600 MG tablet Commonly known as:  ADVIL,MOTRIN Take 1 tablet (600 mg total) by  mouth every 6 (six) hours.   oxyCODONE-acetaminophen 5-325 MG tablet Commonly known as:  PERCOCET/ROXICET Take 1 tablet by mouth every 4 (four) hours as needed (pain scale 4-7).   prenatal multivitamin Tabs tablet Take 1 tablet by mouth daily at 12 noon.   senna-docusate 8.6-50 MG tablet Commonly known as:  Senokot-S Take 2 tablets by mouth daily. Start taking on:  07/02/2017   sertraline 25 MG tablet Commonly known as:  ZOLOFT Take 25 mg by mouth daily.   simethicone 80 MG chewable tablet Commonly known as:  MYLICON Chew 1 tablet (80 mg total) by mouth 3 (three) times daily after meals.       Diet: routine diet  Activity: Advance as tolerated. Pelvic rest for 6 weeks.   Outpatient follow up:2 weeks  Postpartum contraception: Not Discussed  Newborn Data: Live born female Remonia Richter Birth Weight: 6 lb 5.4 oz (2875 g) APGAR: 8, 9  Newborn Delivery   Birth date/time:  06/28/2017 22:00:00 Delivery type:   C-Section, Low Transverse Trial of labor:  Yes C-section categorization:  Primary     Baby Feeding: Breast, supplementing donor milk Disposition:home with mother   07/01/2017 Neta Mends, CNM

## 2017-07-01 NOTE — Lactation Note (Signed)
This note was copied from a baby's chart. Lactation Consultation Note  Patient Name: Adrienne Calderon ZOXWR'[id number] Date: 07/01/2017 Reason for consult: Follow-up assessment;Late-preterm 34-36.6wks;Primapara;1st time breastfeeding;Infant weight loss;Other (Comment)(less than 6 pounds now ) 6% weight loss  Baby is 40 hours old  LC reviewed and updated the doc flow sheets  LC reviewed the [facility] plan with use of #20 NS, recommended to continue supplementing with  Every feeding until [facility] O/P visit at [facility] using the 41F SNS ( per mom comfortable using it)  LC had mom demo applying the NS and she did well. Good fit for mom. Also showed mom how to use the curved tip syringe to instill EBM or formula into the top  For appetizer.  LC reviewed the importance of protecting the baby's energy level so she will be more  Efficient with feedings.  LC recommended feeding the 1st breast with NS and SNS ( increasing amounts to 30 ml )  And post pump both breast for 10 -15 mins until milk comes in and then it can be reevaluated  At the [facility] appt. With LC . LC instructed mom on the use shells between feedings except when sleeping.  Mom denies soreness. Sore nipple and engorgement prevention and tx reviewed.  LC instructed mom on the use hand pump, shells, and per mom has a DEBP at home.   Per mom waiting for the [facility] office to notify her for F/U appt / LC and Dr.  Mother informed of post-discharge support and given phone number to the lactation department, including services for phone call assistance; out-patient appointments; and breastfeeding support group. List of other breastfeeding resources in the community given in the handout. Encouraged mother to call for problems or concerns related to breastfeeding.   Maternal Data Has patient been taught Hand Expression?: Yes(LC reviewed )  Feeding Feeding Type: (per mom bbay last fed at 720 am  for 20 mins ) Length of feed: 20 min  LATCH Score                    Interventions Interventions: Breast feeding basics reviewed  Lactation Tools Discussed/Used Tools: Shells;Pump(LC ionstructed on the use hand puimp for pre-pumping and Shells ) Nipple shield size: 20 Shell Type: Inverted Breast pump type: Double-Electric Breast Pump;Manual WIC Program: No Pump Review: Setup, frequency, and cleaning   Consult Status Consult Status: Follow-up Date: (per mom will be seeing Adrienne Calderon LC with [facility] ) Follow-up type: Out-patient    Adrienne Calderon Adrienne Calderon 07/01/2017, 10:26 AM

## 2017-07-25 ENCOUNTER — Encounter (HOSPITAL_COMMUNITY): Payer: Self-pay | Admitting: Obstetrics and Gynecology

## 2018-09-15 ENCOUNTER — Other Ambulatory Visit: Payer: Self-pay

## 2018-09-15 DIAGNOSIS — Z20822 Contact with and (suspected) exposure to covid-19: Secondary | ICD-10-CM

## 2018-09-19 LAB — NOVEL CORONAVIRUS, NAA: SARS-CoV-2, NAA: NOT DETECTED

## 2018-09-22 ENCOUNTER — Telehealth: Payer: Self-pay | Admitting: General Practice

## 2018-09-22 NOTE — Telephone Encounter (Signed)
Pt called in for covid results. Advised of covid Not Detected result.

## 2019-06-08 LAB — HIV ANTIBODY (ROUTINE TESTING W REFLEX): HIV Screen 4th Generation wRfx: NONREACTIVE

## 2019-06-08 LAB — OB RESULTS CONSOLE HEPATITIS B SURFACE ANTIGEN: Hepatitis B Surface Ag: NEGATIVE

## 2019-06-08 LAB — OB RESULTS CONSOLE RUBELLA ANTIBODY, IGM: Rubella: IMMUNE

## 2019-06-08 LAB — HEPATITIS C ANTIBODY: HCV Ab: NEGATIVE

## 2019-07-18 ENCOUNTER — Inpatient Hospital Stay (HOSPITAL_COMMUNITY)
Admission: AD | Admit: 2019-07-18 | Discharge: 2019-07-18 | Disposition: A | Discharge status: Home / Self Care | Attending: Obstetrics and Gynecology | Admitting: Obstetrics and Gynecology

## 2019-07-18 ENCOUNTER — Other Ambulatory Visit: Payer: Self-pay

## 2019-07-18 ENCOUNTER — Encounter (HOSPITAL_COMMUNITY): Payer: Self-pay | Admitting: Obstetrics and Gynecology

## 2019-07-18 DIAGNOSIS — Z79899 Other long term (current) drug therapy: Secondary | ICD-10-CM | POA: Insufficient documentation

## 2019-07-18 DIAGNOSIS — O99512 Diseases of the respiratory system complicating pregnancy, second trimester: Secondary | ICD-10-CM | POA: Diagnosis not present

## 2019-07-18 DIAGNOSIS — O99342 Other mental disorders complicating pregnancy, second trimester: Secondary | ICD-10-CM

## 2019-07-18 DIAGNOSIS — F410 Panic disorder [episodic paroxysmal anxiety] without agoraphobia: Secondary | ICD-10-CM | POA: Insufficient documentation

## 2019-07-18 DIAGNOSIS — J45909 Unspecified asthma, uncomplicated: Secondary | ICD-10-CM | POA: Diagnosis not present

## 2019-07-18 DIAGNOSIS — Z3A16 16 weeks gestation of pregnancy: Secondary | ICD-10-CM | POA: Insufficient documentation

## 2019-07-18 LAB — BASIC METABOLIC PANEL
Anion gap: 9 (ref 5–15)
BUN: 5 mg/dL — ABNORMAL LOW (ref 6–20)
CO2: 24 mmol/L (ref 22–32)
Calcium: 8.9 mg/dL (ref 8.9–10.3)
Chloride: 104 mmol/L (ref 98–111)
Creatinine, Ser: 0.46 mg/dL (ref 0.44–1.00)
GFR calc Af Amer: >60 mL/min (ref >60)
GFR calc non Af Amer: >60 mL/min (ref >60)
Glucose, Bld: 77 mg/dL (ref 70–99)
Potassium: 4.2 mmol/L (ref 3.5–5.1)
Sodium: 137 mmol/L (ref 135–145)

## 2019-07-18 LAB — CBC
HCT: 40.5 % (ref 36.0–46.0)
Hemoglobin: 13.6 g/dL (ref 12.0–15.0)
MCH: 30.8 pg (ref 26.0–34.0)
MCHC: 33.6 g/dL (ref 30.0–36.0)
MCV: 91.6 fL (ref 80.0–100.0)
Platelets: 189 10*3/uL (ref 150–400)
RBC: 4.42 MIL/uL (ref 3.87–5.11)
RDW: 12.7 % (ref 11.5–15.5)
WBC: 6.9 10*3/uL (ref 4.0–10.5)
nRBC: 0 % (ref 0.0–0.2)

## 2019-07-18 LAB — URINALYSIS, ROUTINE W REFLEX MICROSCOPIC
Bilirubin Urine: NEGATIVE
Glucose, UA: NEGATIVE mg/dL
Hgb urine dipstick: NEGATIVE
Ketones, ur: NEGATIVE mg/dL
Nitrite: NEGATIVE
Protein, ur: NEGATIVE mg/dL
Specific Gravity, Urine: 1.009 (ref 1.005–1.030)
pH: 7 (ref 5.0–8.0)

## 2019-07-18 MED ORDER — LORAZEPAM 1 MG PO TABS: 1.0000 mg | ORAL_TABLET | Freq: Three times a day (TID) | ORAL | 0 refills | Status: DC | PRN

## 2019-07-18 NOTE — Discharge Instructions (Signed)
Panic Attack A panic attack is a sudden episode of severe anxiety, fear, or discomfort that causes physical and emotional symptoms. The attack may be in response to something frightening, or it may occur for no known reason. Symptoms of a panic attack can be similar to symptoms of a heart attack or stroke. It is important to see your health care provider when you have a panic attack so that these conditions can be ruled out. A panic attack is a symptom of another condition. Most panic attacks go away with treatment of the underlying problem. If you have panic attacks often, you may have a condition called panic disorder. What are the causes? A panic attack may be caused by:  An extreme, life-threatening situation, such as a war or natural disaster.  An anxiety disorder, such as post-traumatic stress disorder.  Depression.  Certain medical conditions, including heart problems, neurological conditions, and infections.  Certain over-the-counter and prescription medicines.  Illegal drugs that increase heart rate and blood pressure, such as methamphetamine.  Alcohol.  Supplements that increase anxiety.  Panic disorder. What increases the risk? You are more likely to develop this condition if:  You have an anxiety disorder.  You have another mental health condition.  You take certain medicines.  You use alcohol, illegal drugs, or other substances.  You are under extreme stress.  A life event is causing increased feelings of anxiety and depression. What are the signs or symptoms? A panic attack starts suddenly, usually lasts about 20 minutes, and occurs with one or more of the following:  A pounding heart.  A feeling that your heart is beating irregularly or faster than normal (palpitations).  Sweating.  Trembling or shaking.  Shortness of breath or feeling smothered.  Feeling choked.  Chest pain or discomfort.  Nausea or a strange feeling in your  stomach.  Dizziness, feeling lightheaded, or feeling like you might faint.  Chills or hot flashes.  Numbness or tingling in your lips, hands, or feet.  Feeling confused, or feeling that you are not yourself.  Fear of losing control or being emotionally unstable.  Fear of dying. How is this diagnosed? A panic attack is diagnosed with an assessment by your health care provider. During the assessment your health care provider will ask questions about:  Your history of anxiety, depression, and panic attacks.  Your medical history.  Whether you drink alcohol, use illegal drugs, take supplements, or take medicines. Be honest about your substance use. Your health care provider may also:  Order blood tests or other kinds of tests to rule out serious medical conditions.  Refer you to a mental health professional for further evaluation. How is this treated? Treatment depends on the cause of the panic attack:  If the cause is a medical problem, your health care provider will either treat that problem or refer you to a specialist.  If the cause is emotional, you may be given anti-anxiety medicines or referred to a counselor. These medicines may reduce how often attacks happen, reduce how severe the attacks are, and lower anxiety.  If the cause is a medicine, your health care provider may tell you to stop the medicine, change your dose, or take a different medicine.  If the cause is a drug, treatment may involve letting the drug wear off and taking medicine to help the drug leave your body or to counteract its effects. Attacks caused by drug abuse may continue even if you stop using the drug. Follow these instructions   at home:  Take over-the-counter and prescription medicines only as told by your health care provider.  If you feel anxious, limit your caffeine intake.  Take good care of your physical and mental health by: ? Eating a balanced diet that includes plenty of fresh fruits and  vegetables, whole grains, lean meats, and low-fat dairy. ? Getting plenty of rest. Try to get 7-8 hours of uninterrupted sleep each night. ? Exercising regularly. Try to get 30 minutes of physical activity at least 5 days a week. ? Not smoking. Talk to your health care provider if you need help quitting. ? Limiting alcohol intake to no more than 1 drink a day for nonpregnant women and 2 drinks a day for men. One drink equals 12 oz of beer, 5 oz of wine, or 1 oz of hard liquor.  Keep all follow-up visits as told by your health care provider. This is important. Panic attacks may have underlying physical or emotional problems that take time to accurately diagnose. Contact a health care provider if:  Your symptoms do not improve, or they get worse.  You are not able to take your medicine as prescribed because of side effects. Get help right away if:  You have serious thoughts about hurting yourself or others.  You have symptoms of a panic attack. Do not drive yourself to the hospital. Have someone else drive you or call an ambulance. If you ever feel like you may hurt yourself or others, or you have thoughts about taking your own life, get help right away. You can go to your nearest emergency department or call:  Your local emergency services (911 in the [place]  A suicide crisis helpline, such as the National Suicide Prevention Lifeline at 807-381-9094. This is open 24 hours a day. Summary  A panic attack is a sign of a serious health or mental health condition. Get help right away. Do not drive yourself to the hospital. Have someone else drive you or call an ambulance.  Always see a health care provider to have the reasons for the panic attack correctly diagnosed.  If your panic attack was caused by a physical problem, follow your health care provider's suggestions for medicine, referral to a specialist, and lifestyle changes.  If your panic attack was caused by an emotional problem,  follow through with counseling from a qualified mental health specialist.  If you feel like you may hurt yourself or others, call 911 and get help right away. This information is not intended to replace advice given to you by your health care provider. Make sure you discuss any questions you have with your health care provider. Document Revised: 01/30/2017 Document Reviewed: 03/28/2016 Elsevier Patient Education  2020 [employer]   Perinatal Anxiety When a woman feels excessive tension or worry (anxiety) during pregnancy or during the first 12 months after she gives birth, she has a condition called perinatal anxiety. Anxiety can interfere with work, school, relationships, and other everyday activities. If it is not managed properly, it can also cause problems in the mother and her baby.  If you are pregnant and you have symptoms of an anxiety disorder, it is important to talk with your health care provider. What are the causes? The exact cause of this condition is not known. Hormonal changes during and after pregnancy may play a role in causing perinatal anxiety. What increases the risk? You are more likely to develop this condition if:  You have a personal or family history of depression,  anxiety, or mood disorders.  You experience a stressful life event during pregnancy, such as the death of a loved one.  You have a lot of regular life stress, such as being a single parent.  You have thyroid problems. What are the signs or symptoms? Perinatal anxiety can be different for everyone. It may include:  Panic attacks (panic disorder). These are intense episodes of fear or discomfort that may also cause sweating, nausea, shortness of breath, or fear of dying. They usually last 5-15 minutes.  Reliving an upsetting (traumatic) event through distressing thoughts, dreams, or flashbacks (post-traumatic stress disorder, or PTSD).  Excessive worry about multiple problems (generalized anxiety  disorder).  Fear and stress about leaving certain people or loved ones (separation anxiety).  Performing repetitive tasks (compulsions) to relieve stress or worry (obsessive compulsive disorder, or OCD).  Fear of certain objects or situations (phobias).  Excessive worrying, such as a constant feeling that something bad is going to happen.  Inability to relax.  Difficulty concentrating.  Sleep problems.  Frequent nightmares or disturbing thoughts. How is this diagnosed? This condition is diagnosed based on a physical exam and mental evaluation. In some cases, your health care provider may use an anxiety screening tool. These tools include a list of questions that can help a health care provider diagnose anxiety. Your health care provider may refer you to a mental health expert who specializes in anxiety. How is this treated? This condition may be treated with:  Medicines. Your health care provider will only give you medicines that have been proven safe for pregnancy and breastfeeding.  Talk therapy with a mental health professional to help change your patterns of thinking (cognitive behavioral therapy).  Mindfulness-based stress reduction.  Other relaxation therapies, such as deep breathing or guided muscle relaxation.  Support groups. Follow these instructions at home: Lifestyle  Do not use any products that contain nicotine or tobacco, such as cigarettes and e-cigarettes. If you need help quitting, ask your health care provider.  Do not use alcohol when you are pregnant. After your baby is born, limit alcohol intake to no more than 1 drink a day. One drink equals 12 oz of beer, 5 oz of wine, or 1 oz of hard liquor.  Consider joining a support group for new mothers. Ask your health care provider for recommendations.  Take good care of yourself. Make sure you: ? Get plenty of sleep. If you are having trouble sleeping, talk with your health care provider. ? Eat a healthy  diet. This includes plenty of fruits and vegetables, whole grains, and lean proteins. ? Exercise regularly, as told by your health care provider. Ask your health care provider what exercises are safe for you. General instructions  Take over-the-counter and prescription medicines only as told by your health care provider.  Talk with your partner or family members about your feelings during pregnancy. Share any concerns or fears that you may have.  Ask for help with tasks or chores when you need it. Ask friends and family members to provide meals, watch your children, or help with cleaning.  Keep all follow-up visits as told by your health care provider. This is important. Contact a health care provider if:  You (or people close to you) notice that you have any symptoms of anxiety or depression.  You have anxiety and your symptoms get worse.  You experience side effects from medicines, such as nausea or sleep problems. Get help right away if:  You feel like hurting  yourself, your baby, or someone else. If you ever feel like you may hurt yourself or others, or have thoughts about taking your own life, get help right away. You can go to your nearest emergency department or call:  Your local emergency services (911 in the [place]  A suicide crisis helpline, such as the National Suicide Prevention Lifeline at 331-672-9077. This is open 24 hours a day. Summary  Perinatal anxiety is when a woman feels excessive tension or worry during pregnancy or during the first 12 months after she gives birth.  Perinatal anxiety may include panic attacks, post-traumatic stress disorder, separation anxiety, phobias, or generalized anxiety.  Perinatal anxiety can cause physical health problems in the mother and baby if not properly managed.  This condition is treated with medicines, talk therapy, stress reduction therapies, or a combination of two or more treatments.  Talk with your partner or family  members about your concerns or fears. Do not be afraid to ask for help. This information is not intended to replace advice given to you by your health care provider. Make sure you discuss any questions you have with your health care provider. Document Revised: 02/20/2017 Document Reviewed: 04/16/2016 Elsevier Patient Education  2020 [employer].

## 2019-07-18 NOTE — MAU Provider Note (Signed)
History     CSN: [id number]  Arrival date and time: 07/18/19 1101   First Provider Initiated Contact with Patient 07/18/19 1203      Chief Complaint  Patient presents with  . Shortness of Breath   HPI Adrienne Calderon is a 24 y.o. G2P0 at [redacted]w[redacted]d who presents to MAU with chief complaint of panic attacks. Patient endorses history of anxiety and depression but states panic attacks are new onset three weeks ago. She states they only occur at night when she is lying down, and she always sleeps on her left side. She endorses chest tightness during her panic attacks, denies pain at any other time. Denies history of cardiac issues but does have a rescue inhaler for history of asthma.  She denies all ob concerns including abdominal pain, vaginal bleeding, abdominal tenderness, fever, recent illness.  She receives care with [facility] and her next appointment is Monday 07/25/2019.  OB History    Gravida  2   Para      Term      Preterm      AB      Living        SAB      TAB      Ectopic      Multiple      Live Births              Past Medical History:  Diagnosis Date  . Asthma    childhood    Past Surgical History:  Procedure Laterality Date  . CESAREAN SECTION N/A 06/28/2017   Procedure: CESAREAN SECTION;  Surgeon: Olivia Mackie, MD;  Location: [facility];  Service: Obstetrics;  Laterality: N/A;  . NO PAST SURGERIES    . WISDOM TOOTH EXTRACTION N/A     Family History  Problem Relation Age of Onset  . Diabetes Maternal Grandmother     Social History   Tobacco Use  . Smoking status: Never Smoker  . Smokeless tobacco: Never Used  Substance Use Topics  . Alcohol use: Not Currently  . Drug use: Never    Allergies:  Allergies  Allergen Reactions  . Benzoyl Peroxide Hives    Medications Prior to Admission  Medication Sig Dispense Refill Last Dose  . acetaminophen (TYLENOL) 325 MG tablet Take 2 tablets (650 mg total) by mouth every 4 (four)  hours as needed (for pain scale < 4).     . coconut oil OIL Apply 1 application topically as needed.  0   . ibuprofen (ADVIL,MOTRIN) 600 MG tablet Take 1 tablet (600 mg total) by mouth every 6 (six) hours. 30 tablet 0   . oxyCODONE-acetaminophen (PERCOCET/ROXICET) 5-325 MG tablet Take 1 tablet by mouth every 4 (four) hours as needed (pain scale 4-7). 30 tablet 0   . Prenatal Vit-Fe Fumarate-FA (PRENATAL MULTIVITAMIN) TABS tablet Take 1 tablet by mouth daily at 12 noon.     . senna-docusate (SENOKOT-S) 8.6-50 MG tablet Take 2 tablets by mouth daily.     . sertraline (ZOLOFT) 25 MG tablet Take 25 mg by mouth daily.     . simethicone (MYLICON) 80 MG chewable tablet Chew 1 tablet (80 mg total) by mouth 3 (three) times daily after meals. 30 tablet 0     Review of Systems  Respiratory: Negative for shortness of breath.   Gastrointestinal: Negative for abdominal pain.  Genitourinary: Negative for vaginal bleeding.  Musculoskeletal: Negative for back pain.  Neurological: Negative for headaches.  All other systems reviewed and  are negative.  Physical Exam   Blood pressure (!) 104/56, pulse 84, temperature 98.4 F (36.9 C), temperature source Oral, resp. rate 16, height 5\' 6"  (1.676 m), weight 88.6 kg, SpO2 99 %, unknown if currently breastfeeding.  Physical Exam  Nursing note and vitals reviewed. Constitutional: She is oriented to person, place, and time. She appears well-developed and well-nourished.  Cardiovascular: Normal rate and normal heart sounds.  Respiratory: Effort normal and breath sounds normal.  GI: Soft.  Neurological: She is alert and oriented to person, place, and time.  Skin: Skin is warm and dry.  Psychiatric: She has a normal mood and affect. Her behavior is normal. Judgment and thought content normal.    MAU Course/MDM  Procedures  --Discussed Zoloft for general mood stabilization with small amount of Ativan PRN for panic attacks. Patient does not feel she Zoloft is  indicated, requests Ativan. Discussed importance of close follow-up with clinic, initiation of CBT PRN.  Patient Vitals for the past 24 hrs:  BP Temp Temp src Pulse Resp SpO2 Height Weight  07/18/19 1136 (!) 104/56 98.4 F (36.9 C) Oral 84 16 99 % -- --  07/18/19 1129 -- -- -- -- -- -- 5\' 6"  (1.676 m) 88.6 kg   Results for orders placed or performed during the hospital encounter of 07/18/19 (from the past 24 hour(s))  CBC     Status: None   Collection Time: 07/18/19 12:27 PM  Result Value Ref Range   WBC 6.9 4.0 - 10.5 K/uL   RBC 4.42 3.87 - 5.11 MIL/uL   Hemoglobin 13.6 12.0 - 15.0 g/dL   HCT 40.5 36.0 - 46.0 %   MCV 91.6 80.0 - 100.0 fL   MCH 30.8 26.0 - 34.0 pg   MCHC 33.6 30.0 - 36.0 g/dL   RDW 12.7 11.5 - 15.5 %   Platelets 189 150 - 400 K/uL   nRBC 0.0 0.0 - 0.2 %  Basic metabolic panel     Status: Abnormal   Collection Time: 07/18/19 12:27 PM  Result Value Ref Range   Sodium 137 135 - 145 mmol/L   Potassium 4.2 3.5 - 5.1 mmol/L   Chloride 104 98 - 111 mmol/L   CO2 24 22 - 32 mmol/L   Glucose, Bld 77 70 - 99 mg/dL   BUN 5 (L) 6 - 20 mg/dL   Creatinine, Ser 0.46 0.44 - 1.00 mg/dL   Calcium 8.9 8.9 - 10.3 mg/dL   GFR calc non Af Amer >60 >60 mL/min   GFR calc Af Amer >60 >60 mL/min   Anion gap 9 5 - 15  Urinalysis, Routine w reflex microscopic     Status: Abnormal   Collection Time: 07/18/19 12:52 PM  Result Value Ref Range   Color, Urine YELLOW YELLOW   APPearance HAZY (A) CLEAR   Specific Gravity, Urine 1.009 1.005 - 1.030   pH 7.0 5.0 - 8.0   Glucose, UA NEGATIVE NEGATIVE mg/dL   Hgb urine dipstick NEGATIVE NEGATIVE   Bilirubin Urine NEGATIVE NEGATIVE   Ketones, ur NEGATIVE NEGATIVE mg/dL   Protein, ur NEGATIVE NEGATIVE mg/dL   Nitrite NEGATIVE NEGATIVE   Leukocytes,Ua SMALL (A) NEGATIVE   RBC / HPF 0-5 0 - 5 RBC/hpf   WBC, UA 0-5 0 - 5 WBC/hpf   Bacteria, UA RARE (A) NONE SEEN   Squamous Epithelial / LPF 6-10 0 - 5   Mucus PRESENT    Meds ordered this  encounter  Medications  . LORazepam (ATIVAN) 1  MG tablet    Sig: Take 1 tablet (1 mg total) by mouth 3 (three) times daily as needed for anxiety.    Dispense:  15 tablet    Refill:  0    Order Specific Question:   Supervising Provider    Answer:    Bing [[id number]]   Assessment and Plan  --24 y.o. G2P0 at [redacted]w[redacted]d  --FHT 152 by Doppler --No ob concerns --New rx Ativan for panic attacks --Zoloft declined by patient --Discharge home in stable condition  F/U: --Okay OB 07/25/2019  Calvert Cantor, CNM 07/18/2019, 3:19 PM

## 2019-07-18 NOTE — MAU Note (Signed)
Adrienne Calderon is a 24 y.o. at [redacted]w[redacted]d here in MAU reporting: panic attacks for the past 3 weeks. States they have been increasing over that time period. They happen when she is laying down in bed on her left side. States she is feeling a little bit SOB. Some chest tightness.  Onset of complaint: ongoing  Pain score: 2/10  Vitals:   07/18/19 1136  BP: (!) 104/56  Pulse: 84  Resp: 16  Temp: 98.4 F (36.9 C)  SpO2: 99%     FHT: 152  Lab orders placed from triage: UA

## 2019-11-03 ENCOUNTER — Inpatient Hospital Stay (HOSPITAL_COMMUNITY)

## 2019-11-03 ENCOUNTER — Other Ambulatory Visit: Payer: Self-pay

## 2019-11-03 ENCOUNTER — Inpatient Hospital Stay (HOSPITAL_COMMUNITY)
Admission: AD | Admit: 2019-11-03 | Discharge: 2019-11-03 | Disposition: A | Discharge status: Home / Self Care | Attending: Obstetrics and Gynecology | Admitting: Obstetrics and Gynecology

## 2019-11-03 ENCOUNTER — Encounter (HOSPITAL_COMMUNITY): Payer: Self-pay | Admitting: Obstetrics and Gynecology

## 2019-11-03 DIAGNOSIS — Z3A31 31 weeks gestation of pregnancy: Secondary | ICD-10-CM | POA: Insufficient documentation

## 2019-11-03 DIAGNOSIS — J1282 Pneumonia due to coronavirus disease 2019: Secondary | ICD-10-CM | POA: Diagnosis not present

## 2019-11-03 DIAGNOSIS — O98513 Other viral diseases complicating pregnancy, third trimester: Secondary | ICD-10-CM | POA: Insufficient documentation

## 2019-11-03 DIAGNOSIS — Z79899 Other long term (current) drug therapy: Secondary | ICD-10-CM | POA: Insufficient documentation

## 2019-11-03 DIAGNOSIS — J45909 Unspecified asthma, uncomplicated: Secondary | ICD-10-CM

## 2019-11-03 DIAGNOSIS — J4520 Mild intermittent asthma, uncomplicated: Secondary | ICD-10-CM

## 2019-11-03 DIAGNOSIS — R0602 Shortness of breath: Secondary | ICD-10-CM

## 2019-11-03 DIAGNOSIS — O99513 Diseases of the respiratory system complicating pregnancy, third trimester: Secondary | ICD-10-CM | POA: Insufficient documentation

## 2019-11-03 DIAGNOSIS — U071 COVID-19: Secondary | ICD-10-CM | POA: Diagnosis not present

## 2019-11-03 HISTORY — DX: Unspecified infectious disease: B99.9

## 2019-11-03 HISTORY — DX: Depression, unspecified: F32.A

## 2019-11-03 LAB — COMPREHENSIVE METABOLIC PANEL
ALT: 35 U/L (ref 0–44)
AST: 49 U/L — ABNORMAL HIGH (ref 15–41)
Albumin: 2.4 g/dL — ABNORMAL LOW (ref 3.5–5.0)
Alkaline Phosphatase: 111 U/L (ref 38–126)
Anion gap: 9 (ref 5–15)
BUN: <5 mg/dL — ABNORMAL LOW (ref 6–20)
CO2: 23 mmol/L (ref 22–32)
Calcium: 8.3 mg/dL — ABNORMAL LOW (ref 8.9–10.3)
Chloride: 100 mmol/L (ref 98–111)
Creatinine, Ser: 0.52 mg/dL (ref 0.44–1.00)
GFR calc Af Amer: >60 mL/min (ref >60)
GFR calc non Af Amer: >60 mL/min (ref >60)
Glucose, Bld: 84 mg/dL (ref 70–99)
Potassium: 4 mmol/L (ref 3.5–5.1)
Sodium: 132 mmol/L — ABNORMAL LOW (ref 135–145)
Total Bilirubin: 1.1 mg/dL (ref 0.3–1.2)
Total Protein: 6.2 g/dL — ABNORMAL LOW (ref 6.5–8.1)

## 2019-11-03 LAB — URINALYSIS, ROUTINE W REFLEX MICROSCOPIC
Bilirubin Urine: NEGATIVE
Glucose, UA: NEGATIVE mg/dL
Hgb urine dipstick: NEGATIVE
Ketones, ur: 80 mg/dL — AB
Nitrite: NEGATIVE
Protein, ur: 30 mg/dL — AB
Specific Gravity, Urine: 1.021 (ref 1.005–1.030)
pH: 6 (ref 5.0–8.0)

## 2019-11-03 LAB — LACTATE DEHYDROGENASE: LDH: 196 U/L — ABNORMAL HIGH (ref 98–192)

## 2019-11-03 LAB — CBC WITH DIFFERENTIAL/PLATELET
Abs Immature Granulocytes: 0 10*3/uL (ref 0.00–0.07)
Basophils Absolute: 0 10*3/uL (ref 0.0–0.1)
Basophils Relative: 1 %
Eosinophils Absolute: 0 10*3/uL (ref 0.0–0.5)
Eosinophils Relative: 1 %
HCT: 39.6 % (ref 36.0–46.0)
Hemoglobin: 13.4 g/dL (ref 12.0–15.0)
Lymphocytes Relative: 17 %
Lymphs Abs: 0.8 10*3/uL (ref 0.7–4.0)
MCH: 30.8 pg (ref 26.0–34.0)
MCHC: 33.8 g/dL (ref 30.0–36.0)
MCV: 91 fL (ref 80.0–100.0)
Monocytes Absolute: 0.1 10*3/uL (ref 0.1–1.0)
Monocytes Relative: 3 %
Neutro Abs: 3.8 10*3/uL (ref 1.7–7.7)
Neutrophils Relative %: 78 %
Platelets: 172 10*3/uL (ref 150–400)
RBC: 4.35 MIL/uL (ref 3.87–5.11)
RDW: 13.2 % (ref 11.5–15.5)
WBC: 4.9 10*3/uL (ref 4.0–10.5)
nRBC: 0 % (ref 0.0–0.2)
nRBC: 0 /100 WBC

## 2019-11-03 LAB — TROPONIN I (HIGH SENSITIVITY): Troponin I (High Sensitivity): 4 ng/L (ref <18)

## 2019-11-03 LAB — C-REACTIVE PROTEIN: CRP: 0.8 mg/dL (ref <1.0)

## 2019-11-03 LAB — D-DIMER, QUANTITATIVE: D-Dimer, Quant: 0.79 ug/mL-FEU — ABNORMAL HIGH (ref 0.00–0.50)

## 2019-11-03 MED ORDER — ALBUTEROL SULFATE HFA 108 (90 BASE) MCG/ACT IN AERS: 2.0000 | INHALATION_SPRAY | Freq: Once | RESPIRATORY_TRACT | Status: DC | PRN

## 2019-11-03 MED ORDER — SODIUM CHLORIDE 0.9 % IV SOLN: 1200.0000 mg | Freq: Once | INTRAVENOUS | Status: DC

## 2019-11-03 MED ORDER — DIPHENHYDRAMINE HCL 50 MG/ML IJ SOLN: 50.0000 mg | Freq: Once | INTRAMUSCULAR | Status: DC | PRN

## 2019-11-03 MED ORDER — SODIUM CHLORIDE 0.9 % IV SOLN: INTRAVENOUS | Status: DC | PRN

## 2019-11-03 MED ORDER — FAMOTIDINE IN NACL 20-0.9 MG/50ML-% IV SOLN: 20.0000 mg | Freq: Once | INTRAVENOUS | Status: DC | PRN

## 2019-11-03 MED ORDER — EPINEPHRINE 0.3 MG/0.3ML IJ SOAJ: 0.3000 mg | Freq: Once | INTRAMUSCULAR | Status: DC | PRN

## 2019-11-03 MED ORDER — PREDNISONE 10 MG PO TABS: 40.0000 mg | ORAL_TABLET | Freq: Every day | ORAL | 0 refills | Status: AC

## 2019-11-03 MED ORDER — METHYLPREDNISOLONE SODIUM SUCC 125 MG IJ SOLR: 125.0000 mg | Freq: Once | INTRAMUSCULAR | Status: DC | PRN

## 2019-11-03 NOTE — MAU Note (Signed)
Dx with covid last wk on Wed, fever, prod cough; that is gone, no fever in 2 days, but now she feels SOB.  Called office , was told "to come here so that her lungs could be assessed".  Denies loss of taste or sense of smell.   Denies any OB problems, no bleeding or LOF, no abd pain, reports +FM.

## 2019-11-03 NOTE — Discharge Instructions (Signed)
10 Things You Can Do to Manage Your COVID-19 Symptoms at Home If you have possible or confirmed COVID-19: 1. Stay home from work and school. And stay away from other public places. If you must go out, avoid using any kind of public transportation, ridesharing, or taxis. 2. Monitor your symptoms carefully. If your symptoms get worse, call your healthcare provider immediately. 3. Get rest and stay hydrated. 4. If you have a medical appointment, call the healthcare provider ahead of time and tell them that you have or may have COVID-19. 5. For medical emergencies, call 911 and notify the dispatch personnel that you have or may have COVID-19. 6. Cover your cough and sneezes with a tissue or use the inside of your elbow. 7. Wash your hands often with soap and water for at least 20 seconds or clean your hands with an alcohol-based hand sanitizer that contains at least 60% alcohol. 8. As much as possible, stay in a specific room and away from other people in your home. Also, you should use a separate bathroom, if available. If you need to be around other people in or outside of the home, wear a mask. 9. Avoid sharing personal items with other people in your household, like dishes, towels, and bedding. 10. Clean all surfaces that are touched often, like counters, tabletops, and doorknobs. Use household cleaning sprays or wipes according to the label instructions. cdc.gov/coronavirus 09/01/2018 This information is not intended to replace advice given to you by your health care provider. Make sure you discuss any questions you have with your health care provider. Document Revised: 02/03/2019 Document Reviewed: 02/03/2019 Elsevier Patient Education  2020 Elsevier Inc.  

## 2019-11-03 NOTE — MAU Provider Note (Addendum)
History     CSN: [id number]  Arrival date and time: 11/03/19 1506   None     Chief Complaint  Patient presents with  . Shortness of Breath   HPI  Patient is a g2p0 at [redacted]w[redacted]d with a history of mild intermittent asthma who presents from the ER with reports of feeling short of breath. Patient was diagnosed with Covid last week on Wednesday and experienced a fever, productive cough. She says that these symptoms have improved (afebrile for two days) but that she is now feeling short of breath a little more today than usual. She reports noticing it this morning. She says it feels hard to take a deep breath and she feels short of breath with ambulating and talking. She still have a cough but it is dry and less productive than prior. She feels some chest tightness but no pain. She has been taking an inhaler (unsure what kind) at home that helped her cough but has not helped the shortness of breath all that much. She does not need to take a break when walking or talking, it just feels a little more breathless.   She does say that on Monday she had an episode of having extreme rib pain on the right side where she thought she had broke her rib, but that this pain has now resolved. Denies leg swelling. Denies hx of blood clots.   She is unvaccinated.  No fevers or chills, no sore throat. Still has dry cough.   Does not have any pregnancy complaint. Reports good fetal movement, denies contractions. Denies LOF or VB.     OB History    Gravida  2   Para      Term      Preterm      AB      Living        SAB      TAB      Ectopic      Multiple      Live Births              Past Medical History:  Diagnosis Date  . Asthma    childhood    Past Surgical History:  Procedure Laterality Date  . CESAREAN SECTION N/A 06/28/2017   Procedure: CESAREAN SECTION;  Surgeon: Olivia Mackie, MD;  Location: [facility]ING SU[facility];  Service: Obstetrics;  Laterality: N/A;  . NO PAST SURGERIES     . WISDOM TOOTH EXTRACTION N/A     Family History  Problem Relation Age of Onset  . Diabetes Maternal Grandmother     Social History   Tobacco Use  . Smoking status: Never Smoker  . Smokeless tobacco: Never Used  Substance Use Topics  . Alcohol use: Not Currently  . Drug use: Never    Allergies:  Allergies  Allergen Reactions  . Benzoyl Peroxide Hives    Medications Prior to Admission  Medication Sig Dispense Refill Last Dose  . LORazepam (ATIVAN) 1 MG tablet Take 1 tablet (1 mg total) by mouth 3 (three) times daily as needed for anxiety. 15 tablet 0     Review of Systems  Constitutional: Negative for chills, fatigue and fever.  Respiratory: Positive for cough, chest tightness and shortness of breath. Negative for wheezing.   Cardiovascular: Negative for chest pain and leg swelling.  Gastrointestinal: Negative for abdominal pain, constipation, diarrhea and nausea.  Musculoskeletal: Negative for myalgias.  Neurological: Negative for dizziness and headaches.   Physical Exam   Blood pressure  107/63, pulse 98, temperature (!) 97.5 F (36.4 C), temperature source Oral, resp. rate 20, height 5\' 6"  (1.676 m), weight 90.2 kg, SpO2 99 %, unknown if currently breastfeeding.  Physical Exam Vitals and nursing note reviewed.  Constitutional:      Appearance: She is well-developed.  HENT:     Head: Normocephalic and atraumatic.  Pulmonary:     Effort: Pulmonary effort is normal.     Comments: O2 Sat normal on room air  Good inspiratory effort. Good air movement, breath sounds breast in all fields. Initial crackles RLL that resolve with deep inhalation. Rib cage is nontender to palpation. No wheezes or rhonchi.  Chest:     Chest wall: No tenderness.  Abdominal:     Palpations: Abdomen is soft.  Skin:    General: Skin is warm and dry.  Neurological:     Mental Status: She is alert.  Psychiatric:        Mood and Affect: Mood normal.        Behavior: Behavior normal.      MAU Course  Procedures  MDM Patient evaluated at bedside  CXR, CBC, CMP, Troponin, EKG CXR showing multifocal pneumonia  NST Reactive - 130 baseline, mod variability, pos accels, neg decels. Toco: no ctx  Case discussed with Dr. , added on d dimer, crp, ldh   UA with 80 ketones, patient eating and drinking without issue. Discussed increasing PO intake with patient and she verbalizes understanding.  Pharmacy consulted - discussed options of either being admitted for treatment with Regen-Cov (new medication used to prevent progression to worse covid symptoms) versus discharge home with prednisone burst. Patient declines admission and does not with to have an IV medication. Reports she is feeling better since resting in MAU and wishes to go home.    Assessment and Plan   Covid Pneumonia in Pregnancy, Asthma -VSS, NST reactive, labs overall normal including normal CRP, no leukocytosis  -case discussed with [facility], [facility]. [facility] and Dr. Gerri Lins. Patient is stable and afebrile without oxygen desaturations while ambulating and talking. Does not meet criteria for remdesivir but borderline meets criteria for therapy with IV Regen-Cov. Discussed option of admission with patient for this IV therapy but she declines.  -discussed strict return precautions with patient -given hx of asthma in setting of Covid-19 infection, will send home with prednisone burst and discussed use of albuterol inhaler, etc.  -discussed length of quarantine and when she may return to work.  -patient stable for d/c from MAU     Macon Large 11/03/2019, 3:33 PM

## 2019-11-03 NOTE — MAU Note (Signed)
Pt taken to neg pressure rm,  SOB instructed he could not wait, is also covid +, dx last Wed.

## 2019-12-06 LAB — OB RESULTS CONSOLE GBS: GBS: POSITIVE

## 2019-12-23 ENCOUNTER — Encounter (HOSPITAL_COMMUNITY): Payer: Self-pay

## 2019-12-23 NOTE — Patient Instructions (Signed)
Adrienne Calderon  12/23/2019   Your procedure is scheduled on:  01/06/2020  Arrive at 0530 at Entrance C on [address] at [facility]  and [address]. You are invited to use the FREE valet parking or use the Visitor's parking deck.  Pick up the phone at the desk and dial [phone].  Call this number if you have problems the morning of surgery: [phone]  Remember:   Do not eat food:(After Midnight) Desps de medianoche.  Do not drink clear liquids: (After Midnight) Desps de medianoche.  Take these medicines the morning of surgery with A SIP OF WATER:  none   Do not wear jewelry, make-up or nail polish.  Do not wear lotions, powders, or perfumes. Do not wear deodorant.  Do not shave 48 hours prior to surgery.  Do not bring valuables to the hospital.  [facility] is not   responsible for any belongings or valuables brought to the hospital.  Contacts, dentures or bridgework may not be worn into surgery.  Leave suitcase in the car. After surgery it may be brought to your room.  For patients admitted to the hospital, checkout time is 11:00 AM the day of              discharge.      Please read over the following fact sheets that you were given:     Preparing for Surgery

## 2019-12-30 ENCOUNTER — Telehealth (HOSPITAL_COMMUNITY): Payer: Self-pay | Admitting: *Deleted

## 2019-12-30 NOTE — Telephone Encounter (Signed)
Preadmission screen  

## 2019-12-31 ENCOUNTER — Inpatient Hospital Stay (HOSPITAL_COMMUNITY)
Admission: AD | Admit: 2019-12-31 | Discharge: 2020-01-04 | DRG: 786 | Disposition: A | Discharge status: Home / Self Care | Attending: Obstetrics and Gynecology | Admitting: Obstetrics and Gynecology

## 2019-12-31 ENCOUNTER — Other Ambulatory Visit: Payer: Self-pay

## 2019-12-31 DIAGNOSIS — Z20822 Contact with and (suspected) exposure to covid-19: Secondary | ICD-10-CM | POA: Diagnosis present

## 2019-12-31 DIAGNOSIS — Z23 Encounter for immunization: Secondary | ICD-10-CM

## 2019-12-31 DIAGNOSIS — Z98891 History of uterine scar from previous surgery: Secondary | ICD-10-CM

## 2019-12-31 DIAGNOSIS — O99824 Streptococcus B carrier state complicating childbirth: Secondary | ICD-10-CM | POA: Diagnosis present

## 2019-12-31 DIAGNOSIS — O9081 Anemia of the puerperium: Secondary | ICD-10-CM | POA: Diagnosis not present

## 2019-12-31 DIAGNOSIS — Z3A40 40 weeks gestation of pregnancy: Secondary | ICD-10-CM

## 2019-12-31 DIAGNOSIS — O34211 Maternal care for low transverse scar from previous cesarean delivery: Principal | ICD-10-CM | POA: Diagnosis present

## 2019-12-31 DIAGNOSIS — F410 Panic disorder [episodic paroxysmal anxiety] without agoraphobia: Secondary | ICD-10-CM | POA: Diagnosis present

## 2019-12-31 DIAGNOSIS — I471 Supraventricular tachycardia: Secondary | ICD-10-CM | POA: Diagnosis not present

## 2019-12-31 DIAGNOSIS — O4290 Premature rupture of membranes, unspecified as to length of time between rupture and onset of labor, unspecified weeks of gestation: Secondary | ICD-10-CM | POA: Diagnosis present

## 2019-12-31 DIAGNOSIS — O9942 Diseases of the circulatory system complicating childbirth: Secondary | ICD-10-CM | POA: Diagnosis not present

## 2019-12-31 DIAGNOSIS — O99344 Other mental disorders complicating childbirth: Secondary | ICD-10-CM | POA: Diagnosis present

## 2019-12-31 DIAGNOSIS — D62 Acute posthemorrhagic anemia: Secondary | ICD-10-CM | POA: Diagnosis not present

## 2019-12-31 NOTE — MAU Note (Addendum)
Pt had ROM around 845pm. Having ctx 10-15 min apart. Good FM. No bleeding. Plans VBAC.

## 2020-01-01 ENCOUNTER — Encounter (HOSPITAL_COMMUNITY): Payer: Self-pay | Admitting: Obstetrics and Gynecology

## 2020-01-01 ENCOUNTER — Encounter (HOSPITAL_COMMUNITY)
Admission: AD | Disposition: A | Discharge status: Home / Self Care | Payer: Self-pay | Source: Home / Self Care | Attending: Obstetrics and Gynecology

## 2020-01-01 ENCOUNTER — Inpatient Hospital Stay (HOSPITAL_COMMUNITY): Admitting: Anesthesiology

## 2020-01-01 DIAGNOSIS — O26893 Other specified pregnancy related conditions, third trimester: Secondary | ICD-10-CM | POA: Diagnosis present

## 2020-01-01 DIAGNOSIS — O9942 Diseases of the circulatory system complicating childbirth: Secondary | ICD-10-CM | POA: Diagnosis not present

## 2020-01-01 DIAGNOSIS — I471 Supraventricular tachycardia: Secondary | ICD-10-CM | POA: Diagnosis not present

## 2020-01-01 DIAGNOSIS — Z3A40 40 weeks gestation of pregnancy: Secondary | ICD-10-CM

## 2020-01-01 DIAGNOSIS — R9431 Abnormal electrocardiogram [ECG] [EKG]: Secondary | ICD-10-CM | POA: Diagnosis not present

## 2020-01-01 DIAGNOSIS — O480 Post-term pregnancy: Secondary | ICD-10-CM

## 2020-01-01 DIAGNOSIS — Z98891 History of uterine scar from previous surgery: Secondary | ICD-10-CM

## 2020-01-01 DIAGNOSIS — O9081 Anemia of the puerperium: Secondary | ICD-10-CM | POA: Diagnosis not present

## 2020-01-01 DIAGNOSIS — O34211 Maternal care for low transverse scar from previous cesarean delivery: Secondary | ICD-10-CM | POA: Diagnosis present

## 2020-01-01 DIAGNOSIS — Z23 Encounter for immunization: Secondary | ICD-10-CM | POA: Diagnosis not present

## 2020-01-01 DIAGNOSIS — O4290 Premature rupture of membranes, unspecified as to length of time between rupture and onset of labor, unspecified weeks of gestation: Secondary | ICD-10-CM | POA: Diagnosis present

## 2020-01-01 DIAGNOSIS — O99344 Other mental disorders complicating childbirth: Secondary | ICD-10-CM | POA: Diagnosis present

## 2020-01-01 DIAGNOSIS — D62 Acute posthemorrhagic anemia: Secondary | ICD-10-CM | POA: Diagnosis not present

## 2020-01-01 DIAGNOSIS — O99824 Streptococcus B carrier state complicating childbirth: Secondary | ICD-10-CM | POA: Diagnosis present

## 2020-01-01 DIAGNOSIS — Z20822 Contact with and (suspected) exposure to covid-19: Secondary | ICD-10-CM | POA: Diagnosis present

## 2020-01-01 DIAGNOSIS — F410 Panic disorder [episodic paroxysmal anxiety] without agoraphobia: Secondary | ICD-10-CM | POA: Diagnosis present

## 2020-01-01 LAB — CBC
HCT: 38.2 % (ref 36.0–46.0)
Hemoglobin: 12.3 g/dL (ref 12.0–15.0)
MCH: 29.5 pg (ref 26.0–34.0)
MCHC: 32.2 g/dL (ref 30.0–36.0)
MCV: 91.6 fL (ref 80.0–100.0)
Platelets: 173 10*3/uL (ref 150–400)
RBC: 4.17 MIL/uL (ref 3.87–5.11)
RDW: 13.9 % (ref 11.5–15.5)
WBC: 6.6 10*3/uL (ref 4.0–10.5)
nRBC: 0 % (ref 0.0–0.2)

## 2020-01-01 LAB — COMPREHENSIVE METABOLIC PANEL WITH GFR
ALT: 9 U/L (ref 0–44)
AST: 29 U/L (ref 15–41)
Albumin: 3 g/dL — ABNORMAL LOW (ref 3.5–5.0)
Alkaline Phosphatase: 163 U/L — ABNORMAL HIGH (ref 38–126)
Anion gap: 10 (ref 5–15)
BUN: 10 mg/dL (ref 6–20)
CO2: 23 mmol/L (ref 22–32)
Calcium: 9.1 mg/dL (ref 8.9–10.3)
Chloride: 100 mmol/L (ref 98–111)
Creatinine, Ser: 0.68 mg/dL (ref 0.44–1.00)
GFR, Estimated: >60 mL/min
Glucose, Bld: 80 mg/dL (ref 70–99)
Potassium: 5.6 mmol/L — ABNORMAL HIGH (ref 3.5–5.1)
Sodium: 133 mmol/L — ABNORMAL LOW (ref 135–145)
Total Bilirubin: 1.3 mg/dL — ABNORMAL HIGH (ref 0.3–1.2)
Total Protein: 5.7 g/dL — ABNORMAL LOW (ref 6.5–8.1)

## 2020-01-01 LAB — RPR: RPR Ser Ql: NONREACTIVE

## 2020-01-01 LAB — TYPE AND SCREEN
ABO/RH(D): O POS
Antibody Screen: NEGATIVE

## 2020-01-01 LAB — PHOSPHORUS: Phosphorus: 4.3 mg/dL (ref 2.5–4.6)

## 2020-01-01 LAB — RESPIRATORY PANEL BY RT PCR (FLU A&B, COVID)
Influenza A by PCR: NEGATIVE
Influenza B by PCR: NEGATIVE
SARS Coronavirus 2 by RT PCR: NEGATIVE

## 2020-01-01 LAB — MAGNESIUM: Magnesium: 2.4 mg/dL (ref 1.7–2.4)

## 2020-01-01 LAB — POCT FERN TEST: POCT Fern Test: POSITIVE

## 2020-01-01 SURGERY — Surgical Case
Anesthesia: Spinal

## 2020-01-01 MED ORDER — DIPHENHYDRAMINE HCL 25 MG PO CAPS
25.0000 mg | ORAL_CAPSULE | Freq: Four times a day (QID) | ORAL | Status: DC | PRN
  Administered 2020-01-02: 25 mg via ORAL
  Filled 2020-01-01: qty 1

## 2020-01-01 MED ORDER — OXYTOCIN-SODIUM CHLORIDE 30-0.9 UT/500ML-% IV SOLN
2.5000 [IU]/h | INTRAVENOUS | Status: DC
  Filled 2020-01-01: qty 500

## 2020-01-01 MED ORDER — COCONUT OIL OIL: 1.0000 "application " | TOPICAL_OIL | Status: DC | PRN

## 2020-01-01 MED ORDER — SENNOSIDES-DOCUSATE SODIUM 8.6-50 MG PO TABS
2.0000 | ORAL_TABLET | ORAL | Status: DC
  Administered 2020-01-01 – 2020-01-03 (×3): 2 via ORAL
  Filled 2020-01-01 (×3): qty 2

## 2020-01-01 MED ORDER — ALBUMIN HUMAN 5 % IV SOLN: INTRAVENOUS | Status: DC | PRN

## 2020-01-01 MED ORDER — MEPERIDINE HCL 25 MG/ML IJ SOLN: 6.2500 mg | INTRAMUSCULAR | Status: DC | PRN

## 2020-01-01 MED ORDER — MORPHINE SULFATE (PF) 0.5 MG/ML IJ SOLN
INTRAMUSCULAR | Status: DC | PRN
  Administered 2020-01-01: .15 ug via INTRATHECAL

## 2020-01-01 MED ORDER — OXYTOCIN BOLUS FROM INFUSION: 333.0000 mL | Freq: Once | INTRAVENOUS | Status: DC

## 2020-01-01 MED ORDER — ONDANSETRON HCL 4 MG/2ML IJ SOLN: 4.0000 mg | Freq: Three times a day (TID) | INTRAMUSCULAR | Status: DC | PRN

## 2020-01-01 MED ORDER — DEXAMETHASONE SODIUM PHOSPHATE 4 MG/ML IJ SOLN
INTRAMUSCULAR | Status: AC
  Filled 2020-01-01: qty 1

## 2020-01-01 MED ORDER — DIPHENHYDRAMINE HCL 25 MG PO CAPS: 25.0000 mg | ORAL_CAPSULE | ORAL | Status: DC | PRN

## 2020-01-01 MED ORDER — TETANUS-DIPHTH-ACELL PERTUSSIS 5-2.5-18.5 LF-MCG/0.5 IM SUSY: 0.5000 mL | PREFILLED_SYRINGE | Freq: Once | INTRAMUSCULAR | Status: DC

## 2020-01-01 MED ORDER — MORPHINE SULFATE (PF) 0.5 MG/ML IJ SOLN
INTRAMUSCULAR | Status: AC
  Filled 2020-01-01: qty 10

## 2020-01-01 MED ORDER — BUPIVACAINE IN DEXTROSE 0.75-8.25 % IT SOLN
INTRATHECAL | Status: DC | PRN
  Administered 2020-01-01: 1.65 mL via INTRATHECAL

## 2020-01-01 MED ORDER — PHENYLEPHRINE HCL (PRESSORS) 10 MG/ML IV SOLN
INTRAVENOUS | Status: DC | PRN
  Administered 2020-01-01: 80 ug via INTRAVENOUS
  Administered 2020-01-01: 120 ug via INTRAVENOUS
  Administered 2020-01-01 (×3): 80 ug via INTRAVENOUS
  Administered 2020-01-01 (×2): 120 ug via INTRAVENOUS
  Administered 2020-01-01: 80 ug via INTRAVENOUS
  Administered 2020-01-01: 120 ug via INTRAVENOUS
  Administered 2020-01-01 (×2): 80 ug via INTRAVENOUS
  Administered 2020-01-01: 40 ug via INTRAVENOUS
  Administered 2020-01-01: 120 ug via INTRAVENOUS
  Administered 2020-01-01: 80 ug via INTRAVENOUS

## 2020-01-01 MED ORDER — SIMETHICONE 80 MG PO CHEW
80.0000 mg | CHEWABLE_TABLET | ORAL | Status: DC
  Administered 2020-01-01 – 2020-01-03 (×3): 80 mg via ORAL
  Filled 2020-01-01 (×3): qty 1

## 2020-01-01 MED ORDER — NALBUPHINE HCL 10 MG/ML IJ SOLN: 5.0000 mg | INTRAMUSCULAR | Status: DC | PRN

## 2020-01-01 MED ORDER — METOPROLOL TARTRATE 5 MG/5ML IV SOLN
INTRAVENOUS | Status: DC | PRN
  Administered 2020-01-01 (×3): 2.5 mg via INTRAVENOUS
  Administered 2020-01-01: 5 mg via INTRAVENOUS

## 2020-01-01 MED ORDER — DEXAMETHASONE SODIUM PHOSPHATE 4 MG/ML IJ SOLN
INTRAMUSCULAR | Status: DC | PRN
  Administered 2020-01-01: 4 mg via INTRAVENOUS

## 2020-01-01 MED ORDER — CEFAZOLIN SODIUM-DEXTROSE 2-3 GM-%(50ML) IV SOLR
INTRAVENOUS | Status: DC | PRN
  Administered 2020-01-01: 2 g via INTRAVENOUS

## 2020-01-01 MED ORDER — MENTHOL 3 MG MT LOZG: 1.0000 | LOZENGE | OROMUCOSAL | Status: DC | PRN

## 2020-01-01 MED ORDER — PROPOFOL 10 MG/ML IV BOLUS
INTRAVENOUS | Status: AC
  Filled 2020-01-01: qty 20

## 2020-01-01 MED ORDER — PHENYLEPHRINE HCL-NACL 20-0.9 MG/250ML-% IV SOLN
INTRAVENOUS | Status: AC
  Filled 2020-01-01: qty 250

## 2020-01-01 MED ORDER — LACTATED RINGERS IV SOLN: 500.0000 mL | INTRAVENOUS | Status: DC | PRN

## 2020-01-01 MED ORDER — INFLUENZA VAC SPLIT QUAD 0.5 ML IM SUSY
0.5000 mL | PREFILLED_SYRINGE | INTRAMUSCULAR | Status: AC
  Administered 2020-01-04: 0.5 mL via INTRAMUSCULAR

## 2020-01-01 MED ORDER — ACETAMINOPHEN 160 MG/5ML PO SOLN: 325.0000 mg | ORAL | Status: DC | PRN

## 2020-01-01 MED ORDER — SOD CITRATE-CITRIC ACID 500-334 MG/5ML PO SOLN
30.0000 mL | ORAL | Status: DC | PRN
  Administered 2020-01-01: 30 mL via ORAL
  Filled 2020-01-01: qty 15

## 2020-01-01 MED ORDER — OXYCODONE HCL 5 MG PO TABS: 5.0000 mg | ORAL_TABLET | Freq: Once | ORAL | Status: DC | PRN

## 2020-01-01 MED ORDER — LACTATED RINGERS IV SOLN: INTRAVENOUS | Status: DC

## 2020-01-01 MED ORDER — SCOPOLAMINE 1 MG/3DAYS TD PT72: 1.0000 | MEDICATED_PATCH | Freq: Once | TRANSDERMAL | Status: DC

## 2020-01-01 MED ORDER — IBUPROFEN 800 MG PO TABS: 800.0000 mg | ORAL_TABLET | Freq: Three times a day (TID) | ORAL | Status: DC

## 2020-01-01 MED ORDER — NALBUPHINE HCL 10 MG/ML IJ SOLN: 5.0000 mg | Freq: Once | INTRAMUSCULAR | Status: DC | PRN

## 2020-01-01 MED ORDER — ONDANSETRON HCL 4 MG/2ML IJ SOLN: 4.0000 mg | Freq: Four times a day (QID) | INTRAMUSCULAR | Status: DC | PRN

## 2020-01-01 MED ORDER — NALOXONE HCL 0.4 MG/ML IJ SOLN: 0.4000 mg | INTRAMUSCULAR | Status: DC | PRN

## 2020-01-01 MED ORDER — OXYCODONE HCL 5 MG/5ML PO SOLN: 5.0000 mg | Freq: Once | ORAL | Status: DC | PRN

## 2020-01-01 MED ORDER — PHENYLEPHRINE HCL-NACL 20-0.9 MG/250ML-% IV SOLN
INTRAVENOUS | Status: DC | PRN
  Administered 2020-01-01: 60 ug/min via INTRAVENOUS

## 2020-01-01 MED ORDER — SIMETHICONE 80 MG PO CHEW
80.0000 mg | CHEWABLE_TABLET | Freq: Three times a day (TID) | ORAL | Status: DC
  Administered 2020-01-02 – 2020-01-04 (×7): 80 mg via ORAL
  Filled 2020-01-01 (×7): qty 1

## 2020-01-01 MED ORDER — CEFAZOLIN SODIUM-DEXTROSE 2-4 GM/100ML-% IV SOLN
INTRAVENOUS | Status: AC
  Filled 2020-01-01: qty 100

## 2020-01-01 MED ORDER — ONDANSETRON HCL 4 MG/2ML IJ SOLN
INTRAMUSCULAR | Status: DC | PRN
  Administered 2020-01-01: 4 mg via INTRAVENOUS

## 2020-01-01 MED ORDER — PHENYLEPHRINE 40 MCG/ML (10ML) SYRINGE FOR IV PUSH (FOR BLOOD PRESSURE SUPPORT)
PREFILLED_SYRINGE | INTRAVENOUS | Status: AC
  Filled 2020-01-01: qty 30

## 2020-01-01 MED ORDER — OXYCODONE-ACETAMINOPHEN 5-325 MG PO TABS: 2.0000 | ORAL_TABLET | ORAL | Status: DC | PRN

## 2020-01-01 MED ORDER — MEPERIDINE HCL 25 MG/ML IJ SOLN
INTRAMUSCULAR | Status: DC | PRN
  Administered 2020-01-01: 12.5 mg via INTRAVENOUS

## 2020-01-01 MED ORDER — ONDANSETRON HCL 4 MG/2ML IJ SOLN
INTRAMUSCULAR | Status: AC
  Filled 2020-01-01: qty 2

## 2020-01-01 MED ORDER — ZOLPIDEM TARTRATE 5 MG PO TABS: 5.0000 mg | ORAL_TABLET | Freq: Every evening | ORAL | Status: DC | PRN

## 2020-01-01 MED ORDER — SODIUM CHLORIDE 0.9% FLUSH: 3.0000 mL | INTRAVENOUS | Status: DC | PRN

## 2020-01-01 MED ORDER — IBUPROFEN 800 MG PO TABS
800.0000 mg | ORAL_TABLET | Freq: Three times a day (TID) | ORAL | Status: DC
  Administered 2020-01-01 – 2020-01-04 (×8): 800 mg via ORAL
  Filled 2020-01-01 (×8): qty 1

## 2020-01-01 MED ORDER — LIDOCAINE HCL (PF) 1 % IJ SOLN: 30.0000 mL | INTRAMUSCULAR | Status: DC | PRN

## 2020-01-01 MED ORDER — METOCLOPRAMIDE HCL 5 MG/ML IJ SOLN
INTRAMUSCULAR | Status: DC | PRN
  Administered 2020-01-01: 10 mg via INTRAVENOUS

## 2020-01-01 MED ORDER — KETOROLAC TROMETHAMINE 30 MG/ML IJ SOLN: 30.0000 mg | Freq: Four times a day (QID) | INTRAMUSCULAR | Status: AC | PRN

## 2020-01-01 MED ORDER — PRENATAL MULTIVITAMIN CH
1.0000 | ORAL_TABLET | Freq: Every day | ORAL | Status: DC
  Administered 2020-01-02 – 2020-01-03 (×2): 1 via ORAL
  Filled 2020-01-01 (×2): qty 1

## 2020-01-01 MED ORDER — METOCLOPRAMIDE HCL 5 MG/ML IJ SOLN
INTRAMUSCULAR | Status: AC
  Filled 2020-01-01: qty 2

## 2020-01-01 MED ORDER — PENICILLIN G POT IN DEXTROSE 60000 UNIT/ML IV SOLN
3.0000 10*6.[IU] | INTRAVENOUS | Status: DC
  Administered 2020-01-01 (×2): 3 10*6.[IU] via INTRAVENOUS
  Filled 2020-01-01 (×2): qty 50

## 2020-01-01 MED ORDER — MEPERIDINE HCL 25 MG/ML IJ SOLN
INTRAMUSCULAR | Status: AC
  Filled 2020-01-01: qty 1

## 2020-01-01 MED ORDER — DIBUCAINE (PERIANAL) 1 % EX OINT: 1.0000 "application " | TOPICAL_OINTMENT | CUTANEOUS | Status: DC | PRN

## 2020-01-01 MED ORDER — WITCH HAZEL-GLYCERIN EX PADS: 1.0000 "application " | MEDICATED_PAD | CUTANEOUS | Status: DC | PRN

## 2020-01-01 MED ORDER — ACETAMINOPHEN 325 MG PO TABS: 650.0000 mg | ORAL_TABLET | ORAL | Status: DC | PRN

## 2020-01-01 MED ORDER — FENTANYL CITRATE (PF) 100 MCG/2ML IJ SOLN: 25.0000 ug | INTRAMUSCULAR | Status: DC | PRN

## 2020-01-01 MED ORDER — FLEET ENEMA 7-19 GM/118ML RE ENEM: 1.0000 | ENEMA | Freq: Every day | RECTAL | Status: DC | PRN

## 2020-01-01 MED ORDER — OXYTOCIN-SODIUM CHLORIDE 30-0.9 UT/500ML-% IV SOLN
INTRAVENOUS | Status: DC | PRN
  Administered 2020-01-01: 400 mL via INTRAVENOUS

## 2020-01-01 MED ORDER — ACETAMINOPHEN 325 MG PO TABS: 325.0000 mg | ORAL_TABLET | ORAL | Status: DC | PRN

## 2020-01-01 MED ORDER — OXYCODONE HCL 5 MG PO TABS
5.0000 mg | ORAL_TABLET | ORAL | Status: DC | PRN
  Administered 2020-01-02 – 2020-01-03 (×2): 5 mg via ORAL
  Filled 2020-01-01 (×2): qty 1

## 2020-01-01 MED ORDER — OXYTOCIN-SODIUM CHLORIDE 30-0.9 UT/500ML-% IV SOLN
INTRAVENOUS | Status: AC
  Filled 2020-01-01: qty 500

## 2020-01-01 MED ORDER — KETOROLAC TROMETHAMINE 30 MG/ML IJ SOLN
30.0000 mg | Freq: Four times a day (QID) | INTRAMUSCULAR | Status: AC | PRN
  Administered 2020-01-01: 30 mg via INTRAVENOUS

## 2020-01-01 MED ORDER — NALOXONE HCL 4 MG/10ML IJ SOLN
1.0000 ug/kg/h | INTRAVENOUS | Status: DC | PRN
  Filled 2020-01-01: qty 5

## 2020-01-01 MED ORDER — SODIUM CHLORIDE 0.9 % IR SOLN
Status: DC | PRN
  Administered 2020-01-01: 1000 mL

## 2020-01-01 MED ORDER — DIPHENHYDRAMINE HCL 50 MG/ML IJ SOLN
12.5000 mg | INTRAMUSCULAR | Status: DC | PRN
  Administered 2020-01-01: 12.5 mg via INTRAVENOUS
  Filled 2020-01-01: qty 1

## 2020-01-01 MED ORDER — KETOROLAC TROMETHAMINE 30 MG/ML IJ SOLN
INTRAMUSCULAR | Status: AC
  Filled 2020-01-01: qty 1

## 2020-01-01 MED ORDER — SERTRALINE HCL 50 MG PO TABS
25.0000 mg | ORAL_TABLET | Freq: Every day | ORAL | Status: DC
  Administered 2020-01-01 – 2020-01-04 (×4): 25 mg via ORAL
  Filled 2020-01-01 (×4): qty 1

## 2020-01-01 MED ORDER — ONDANSETRON HCL 4 MG/2ML IJ SOLN: 4.0000 mg | Freq: Once | INTRAMUSCULAR | Status: DC | PRN

## 2020-01-01 MED ORDER — SIMETHICONE 80 MG PO CHEW: 80.0000 mg | CHEWABLE_TABLET | ORAL | Status: DC | PRN

## 2020-01-01 MED ORDER — OXYTOCIN-SODIUM CHLORIDE 30-0.9 UT/500ML-% IV SOLN: 2.5000 [IU]/h | INTRAVENOUS | Status: DC

## 2020-01-01 MED ORDER — LACTATED RINGERS IV SOLN: INTRAVENOUS | Status: DC | PRN

## 2020-01-01 MED ORDER — SODIUM CHLORIDE 0.9 % IV SOLN
5.0000 10*6.[IU] | Freq: Once | INTRAVENOUS | Status: AC
  Administered 2020-01-01: 5 10*6.[IU] via INTRAVENOUS
  Filled 2020-01-01: qty 5

## 2020-01-01 MED ORDER — OXYCODONE-ACETAMINOPHEN 5-325 MG PO TABS: 1.0000 | ORAL_TABLET | ORAL | Status: DC | PRN

## 2020-01-01 SURGICAL SUPPLY — 40 items
BENZOIN TINCTURE PRP APPL 2/3 (GAUZE/BANDAGES/DRESSINGS) ×3 IMPLANT
CHLORAPREP W/TINT 26ML (MISCELLANEOUS) ×3 IMPLANT
CLAMP CORD UMBIL (MISCELLANEOUS) IMPLANT
CLOSURE STERI-STRIP 1/2X4 (GAUZE/BANDAGES/DRESSINGS) ×1
CLOSURE WOUND 1/2 X4 (GAUZE/BANDAGES/DRESSINGS)
CLOTH BEACON ORANGE TIMEOUT ST (SAFETY) ×3 IMPLANT
CLSR STERI-STRIP ANTIMIC 1/2X4 (GAUZE/BANDAGES/DRESSINGS) ×2 IMPLANT
DRAPE C SECTION CLR SCREEN (DRAPES) ×3 IMPLANT
DRSG OPSITE POSTOP 4X10 (GAUZE/BANDAGES/DRESSINGS) ×3 IMPLANT
ELECT REM PT RETURN 9FT ADLT (ELECTROSURGICAL) ×3
ELECTRODE REM PT RTRN 9FT ADLT (ELECTROSURGICAL) ×1 IMPLANT
EXTRACTOR VACUUM KIWI (MISCELLANEOUS) IMPLANT
GLOVE BIO SURGEON STRL SZ 6.5 (GLOVE) ×2 IMPLANT
GLOVE BIO SURGEONS STRL SZ 6.5 (GLOVE) ×1
GLOVE BIOGEL PI IND STRL 7.0 (GLOVE) ×2 IMPLANT
GLOVE BIOGEL PI INDICATOR 7.0 (GLOVE) ×4
GOWN STRL REUS W/TWL LRG LVL3 (GOWN DISPOSABLE) ×6 IMPLANT
HEMOSTAT ARISTA ABSORB 3G PWDR (HEMOSTASIS) ×3 IMPLANT
KIT ABG SYR 3ML LUER SLIP (SYRINGE) IMPLANT
NEEDLE HYPO 25X5/8 SAFETYGLIDE (NEEDLE) IMPLANT
NS IRRIG 1000ML POUR BTL (IV SOLUTION) ×3 IMPLANT
PACK C SECTION WH (CUSTOM PROCEDURE TRAY) ×3 IMPLANT
PAD OB MATERNITY 4.3X12.25 (PERSONAL CARE ITEMS) ×3 IMPLANT
RETRACTOR WND ALEXIS 25 LRG (MISCELLANEOUS) ×1 IMPLANT
RTRCTR C-SECT PINK 25CM LRG (MISCELLANEOUS) IMPLANT
RTRCTR WOUND ALEXIS 25CM LRG (MISCELLANEOUS) ×3
STRIP CLOSURE SKIN 1/2X4 (GAUZE/BANDAGES/DRESSINGS) IMPLANT
SUT CHROMIC 1 CTX 36 (SUTURE) ×6 IMPLANT
SUT PLAIN 0 NONE (SUTURE) IMPLANT
SUT PLAIN 2 0 XLH (SUTURE) ×3 IMPLANT
SUT VIC AB 0 CT1 27 (SUTURE) ×4
SUT VIC AB 0 CT1 27XBRD ANBCTR (SUTURE) ×2 IMPLANT
SUT VIC AB 2-0 CT1 27 (SUTURE) ×2
SUT VIC AB 2-0 CT1 TAPERPNT 27 (SUTURE) ×1 IMPLANT
SUT VIC AB 3-0 CT1 27 (SUTURE)
SUT VIC AB 3-0 CT1 TAPERPNT 27 (SUTURE) IMPLANT
SUT VIC AB 4-0 KS 27 (SUTURE) ×3 IMPLANT
TOWEL OR 17X24 6PK STRL BLUE (TOWEL DISPOSABLE) ×3 IMPLANT
TRAY FOLEY W/BAG SLVR 14FR LF (SET/KITS/TRAYS/PACK) ×3 IMPLANT
WATER STERILE IRR 1000ML POUR (IV SOLUTION) ×3 IMPLANT

## 2020-01-01 NOTE — Progress Notes (Signed)
RN entered the room to pt rocking and swaying. RN noted FHT in 60's. Pulse oz applied. Ctx firm and not relaxing. Pt put in bed and placed on side. Prolonged decel resolved with lateral position change. Dr. Mindi Slicker notified.

## 2020-01-01 NOTE — MAU Provider Note (Signed)
None      S: Ms. JIAYI LENGACHER is a 24 y.o. G2P0101 at [redacted]w[redacted]d  who presents to MAU today complaining of leaking of fluid since 0845 pm. She denies vaginal bleeding. She endorses contractions. She reports normal fetal movement.    O: BP 119/72 (BP Location: Right Arm)   Pulse 69   Temp 98.3 F (36.8 C) (Oral)   Resp 16   Ht 5\' 6"  (1.676 m)   Wt 95.4 kg   SpO2 98%   BMI 33.94 kg/m  GENERAL: Well-developed, well-nourished female in no acute distress.  HEAD: Normocephalic, atraumatic.  CHEST: Normal effort of breathing, regular heart rate ABDOMEN: Soft, nontender, gravid PELVIC: Normal external female genitalia. Vagina is pink and rugated. Cervix with normal contour, no lesions. Normal discharge.  Positive pooling of clear fluid.   Cervical exam:  Dilation: 3.5 Effacement (%): 70, 80 Cervical Position: Middle (right) Presentation: Vertex Exam by:: [email or web address] RN   Fetal Monitoring: Baseline: 135 Variability: moderate Accelerations: 15x15 Decelerations: prolonged - down to 90s for 3 minutes Contractions: irregular  Results for orders placed or performed during the hospital encounter of 12/31/19 (from the past 24 hour(s))  POCT fern test     Status: Abnormal   Collection Time: 01/01/20 12:50 AM  Result Value Ref Range   POCT Fern Test Positive = ruptured amniotic membanes      A: [facility] at [redacted]w[redacted]d  [facility]  P: Dr. [redacted]w[redacted]d notified of exam & fetal tracing  Admit to birthing suites PCN for GBS status  Mindi Slicker, NP 01/01/2020 12:55 AM

## 2020-01-01 NOTE — Significant Event (Signed)
Rapid Response Event Note   Reason for Call :  SVT  Initial Focused Assessment:  Called by RN asking for assistance with her patient, who was in active labor, and had gone back into SVT.  Previous vagal maneuver had worked prior, but this time patient was unable to return to NSR.  With the help of CCM, we identified that metoprolol was safe to give vs. labetalol or amio.       Interventions:  Metoprolol 2.5 given while patient was being taken to OR for CS  Plan of Care:  Patient had a successful CS   Event Summary:   MD Notified: Dr. Chestine Spore PCCM Call Time: 1411 Arrival Time: 1413 End Time: 1500  Andrey Spearman, RN

## 2020-01-01 NOTE — Op Note (Addendum)
Operative Note    Preoperative Diagnosis 1. IUP at 40 2/7wks  2. Previous cesarean section 3. Cat 2 strip remote from delivery ( 8cm) 4. New onset SVT   Postoperative Diagnosis        Same 5. Postpartum hemorrhage    Procedure: Low transverse cesarean section with extension into left broad                       Ligament and a double layered closure  Surgeon: Britt Bottom DO Assist : Ronnell Freshwater (surgical tech) Scrub : Lysle Pearl  ( Surgical tech)  Anesthesia: Spinal    Fluids: LR EBL: UOP:  Medications: 10mg  metoprolol given in 4 doses of 2.5mg  each to revert to                       sinus rhythm   Findings: Viable female infant in low vertex position. Uterus with thin and                       bulbous lower uterine segment. Grossly normal tubes and ovaries.                 Apgars 7,9. Cord gas (venous) 7.15  Weight pending      Specimen: Placenta to pathology   INDICATIONS: Pt progressed in labor spontaneously. She has one bout of spontaneous SVT that she spontaneously recovered from after involuntary valsalva during a contraction. She was 6.5cm dilated at this time. Strip remained cat 1 at this time. A while later, she had another episode that was more prolonged. She was noted to be 8cm at this time. Cat 2 strip was also noted.  Anesthesia administered metoprolol while we were awaiting CCM  and cardiology teams.   Given remote from delivery with recurrent SVT, counseled pt regarding options for delivery. Agreed to proceed with urgent repeat cesarean section FHTs 150s in OR  Procedure Note  Patient was taken to the operating room where epidural anesthesia was administered. FHTs were confirmed once again. She was prepped and draped in the normal sterile fashion in the dorsal supine position with a leftward tilt. An appropriate time out was performed. An allis clamp test was performed and anesthesia was found to be adequate.  A Pfannenstiel skin incision was  then made through the previous incision with the scalpel and carried through to the underlying layer of fascia by sharp dissection. The fascia was nicked in the midline and the incision was extended laterally with Mayo scissors. The superior aspect of the incision was grasped kocher clamps and dissected off the underlying rectus muscles. In a similar fashion the inferior aspect was dissected off the rectus muscles. Rectus muscles were separated in the midline and the peritoneal cavity entered bluntly. The peritoneal incision was then extended both superiorly and inferiorly with careful attention to avoid both bowel and bladder. The Alexis self-retaining wound retractor was then placed within the incision and the lower uterine segment exposed. The bladder flap was developed with Metzenbaum scissors and pushed away from the thin lower uterine segment. The lower uterine segment was then incised in a transverse fashion and the cavity itself entered bluntly. Clear amniotic fluid was noted.    The infant's head was then lifted and returned into the uterine cavity then delivered. An excision of the uterine incision was noted during delivery. The remainder of the infant delivered easily and bulb suction of baby's  mouth and nose were performed. Due to low tone, the cord was quickly clamped and cut. The infant was handed off to the waiting NICU team.  The placenta was then spontaneously expressed from the uterus and the uterus cleared of all clots and debris with moist lap sponge. The uterine incision was confirmed to have extended about 3cm into the lower left broad ligament due to very thin tissue. The bowels were packed and the cornua of the incision exposed . The incision was then re appoximated to the point of the low transverse uterine incision. The uterine incision was then repaired in a single layer with a  running locked layer 0 chromic suture. A second imbricated layer was placed using the same suture. Arista was  copiously applied to the area of the extension .Great hemostasis was noted.   The bilateral tubes and ovaries were inspected and found to be grossly normal. The gutters were  cleared of all clots and debris. The uterine incision was inspected and found to be hemostatic. All instruments and sponges as well as the Alexis retractor were then removed from the abdomen. The peritoneum was reapproximated in a running fashion. The rectus muscles were also then reapproximated with the same 2-0 Vicryl. The fascia was then closed with 0 Vicryl in a running fashion. Subcutaneous tissue layer was undermined and reapproximated with 3-0 vicryl  The skin was closed with a subcuticular stitch of 4-0 Vicryl on a Keith needle and then reinforced with benzoin and Steri-Strips. At the conclusion of the procedure all instruments and sponge counts were correct. Patient was taken to the recovery room in good condition with her baby accompanying her skin to skin.

## 2020-01-01 NOTE — Anesthesia Procedure Notes (Addendum)
Spinal  Patient location during procedure: OR Start time: 01/01/2020 2:40 PM End time: 01/01/2020 2:45 PM Staffing Anesthesiologist: Bethena Midget, MD Preanesthetic Checklist Completed: patient identified, IV checked, site marked, risks and benefits discussed, surgical consent, monitors and equipment checked, pre-op evaluation and timeout performed Spinal Block Patient position: sitting Prep: DuraPrep Patient monitoring: heart rate, cardiac monitor, continuous pulse ox and blood pressure Approach: midline Location: L3-4 Injection technique: single-shot Needle Needle type: Sprotte  Needle gauge: 24 G Needle length: 9 cm Assessment Sensory level: T4

## 2020-01-01 NOTE — Anesthesia Preprocedure Evaluation (Signed)
Anesthesia Evaluation  Patient identified by MRN, date of birth, ID band Patient awake    Reviewed: Allergy & Precautions, H&P , NPO status , Patient's Chart, lab work & pertinent test results, reviewed documented beta blocker date and time   Airway Mallampati: I  TM Distance: >3 FB Neck ROM: full    Dental no notable dental hx. (+) Teeth Intact, Dental Advisory Given   Pulmonary asthma ,    Pulmonary exam normal breath sounds clear to auscultation       Cardiovascular + dysrhythmias Supra Ventricular Tachycardia  Rhythm:regular Rate:Normal     Neuro/Psych PSYCHIATRIC DISORDERS Anxiety Depression negative neurological ROS     GI/Hepatic negative GI ROS, Neg liver ROS,   Endo/Other  negative endocrine ROS  Renal/GU negative Renal ROS  negative genitourinary   Musculoskeletal   Abdominal   Peds  Hematology negative hematology ROS (+)   Anesthesia Other Findings   Reproductive/Obstetrics (+) Pregnancy                             Anesthesia Physical Anesthesia Plan  ASA: III and emergent  Anesthesia Plan: Spinal   Post-op Pain Management:    Induction:   PONV Risk Score and Plan:   Airway Management Planned: Natural Airway  Additional Equipment:   Intra-op Plan:   Post-operative Plan:   Informed Consent: I have reviewed the patients History and Physical, chart, labs and discussed the procedure including the risks, benefits and alternatives for the proposed anesthesia with the patient or authorized representative who has indicated his/her understanding and acceptance.       Plan Discussed with: Anesthesiologist  Anesthesia Plan Comments: (2 episodes of SVT,  First broke with maneuvers.   )        Anesthesia Quick Evaluation

## 2020-01-01 NOTE — Progress Notes (Addendum)
Patient ID: Adrienne Calderon, female   DOB: [birth year], 24 y.o.   MRN: [id number] Late entry  Pt progressed well after bout of SVT.  FHT remained at Cat 1, 120s  She reported back pain with contractions that was treated with position changes, heating pack.   I was notified about 2hrs later that pt had begun another bout of SVT I asked for IM hospitalist on call to be called for a consult first as I had been unable to reach cardiology earlier.  I also asked for cardiology to be paged again As I returned to the floor I called MFM for a quick consult myself. We decided tp attempt to convert pt with labetalol due to being comfortable with dosing IV vs metoprolol or amlodipine.   On arrival in the room, pt noted to be uncomfortable with contractions and on her side due to new onset of fetal heart tone deceleration.  On exam, cervix noted to now be 8cm.  I placed an FSE and IUPC.   FHT recovered briefly then another deceleration occurred  At this time, I discussed with pt and husband the circumstances and my concerns . Given remote to delivery with recurrent late decelerations and persistent SVT, I recommended delivery via cesarean section. Given their own previous concerns, they readily agreed.   Staff was notified to prep for urgent repeat cesarean section as FHT back to baseline.   En route to OR I ran into the Critical Care Physiican. I quickly updated her to pts condition and she recommended dosing with metoprolol.   At this time, anesthesiologist Dr Tacy Dura also arrived and agreed with metoprolol  As I arrived in OR, cardiology team also arrived with metoprolol in hand. They were informed a dose had been given. Agreed to post procedural consult for long term management while pt in house.   Pt in OR for repeat c/s

## 2020-01-01 NOTE — Progress Notes (Signed)
Called by RR team for SVT in patient actively laboring. Previously converted with vagal maneuvers, but no longer working.  Metoprolol safe in pregnancy and preferred for SVT- verified with PharmD.  Patient briefly seen at bedside, prepping for emergent CS.Tachycardic, regular rhythm, BP ~100/70. Metoprolol 2.5mg  IV recommended. Discussed with OB & anesthesia, ongoing care per anesthesia.  Steffanie Dunn, DO 01/01/20 3:13 PM [facility]

## 2020-01-01 NOTE — Transfer of Care (Signed)
Immediate Anesthesia Transfer of Care Note  Patient: Adrienne Calderon  Procedure(s) Performed: CESAREAN SECTION (N/A )  Patient Location: PACU  Anesthesia Type:Spinal  Level of Consciousness: awake, alert  and oriented  Airway & Oxygen Therapy: Patient Spontanous Breathing  Post-op Assessment: Report given to RN and Post -op Vital signs reviewed and stable  Post vital signs: Reviewed and stable  Last Vitals:  Vitals Value Taken Time  BP 151/125 01/01/20 1619  Temp    Pulse 95 01/01/20 1619  Resp 19 01/01/20 1620  SpO2 100 % 01/01/20 1619  Vitals shown include unvalidated device data.  Last Pain:  Vitals:   01/01/20 1034  TempSrc:   PainSc: 7          Complications: No complications documented.

## 2020-01-01 NOTE — H&P (Signed)
Adrienne Calderon is a 24 y.o. femaleG2P0101 female presenting with SROM. Pt is dated per LMP which was confirmed with a 10week [place]. Her prenatal course has been complicated by glucose intolerance ( 1/4 abnl x 2 on 3hr), hx c/s x 1 for breech - delivered preterm. Pt declined progesterone.  Pt is interested in TOLAC if labors with no medication support. She expressed she would rather proceed with repeat c/s than accept pitocin for augmentation. She has signed a VBAC consent. Hx panic disorder - on zoloft. Hx asthma  Pt received Tdap vaccine. Plans to get flu shot after delivery. Hx covid - no vaccine GBS pos - nkda Pt had possible exposure to parvo in first trimester. OB History    Gravida  2   Para  1   Term  0   Preterm  1   AB      Living  1     SAB      TAB      Ectopic      Multiple      Live Births  1        Obstetric Comments  #1 SROM  Breech       Past Medical History:  Diagnosis Date  . Asthma    childhood  . Depression    meds working, doing well  . Infection    UTI   Past Surgical History:  Procedure Laterality Date  . CESAREAN SECTION N/A 06/28/2017   Procedure: CESAREAN SECTION;  Surgeon: Adrienne Mackie, MD;  Location: [facility];  Service: Obstetrics;  Laterality: N/A;  . WISDOM TOOTH EXTRACTION N/A    Family History: family history includes Diabetes in her maternal grandmother; Healthy in her father and mother; Rheum arthritis in her sister. Social History:  reports that she has never smoked. She has never used smokeless tobacco. She reports previous alcohol use. She reports that she does not use drugs.     Maternal Diabetes: No Genetic Screening: Declined Maternal Ultrasounds/Referrals: Normal Fetal Ultrasounds or other Referrals:  None Maternal Substance Abuse:  No Significant Maternal Medications:  Meds include: Zoloft Significant Maternal Lab Results:  Group B Strep positive Other Comments:  None  Review of Systems  Constitutional:  Negative for activity change, diaphoresis and fatigue.  Eyes: Negative for photophobia and visual disturbance.  Respiratory: Negative for chest tightness and shortness of breath.   Cardiovascular: Negative for chest pain, palpitations and leg swelling.  Genitourinary: Positive for pelvic pain.  Musculoskeletal: Negative for myalgias.  Neurological: Negative for headaches.  Psychiatric/Behavioral: The patient is not nervous/anxious.    Maternal Medical History:  Reason for admission: Rupture of membranes.   Contractions: Onset was 1 week or more ago.   Perceived severity is mild.    Fetal activity: Perceived fetal activity is normal.    Prenatal complications: no prenatal complications Prenatal Complications - Diabetes: none.    Dilation: 3.5 Effacement (%): 70, 80 Exam by:: Adrienne Bur RN Blood pressure 114/68, pulse 62, temperature 98 F (36.7 C), temperature source Oral, resp. rate 15, height 5\' 6"  (1.676 m), weight 95.4 kg, SpO2 98 %, unknown if currently breastfeeding. Maternal Exam:  Uterine Assessment: Contraction strength is mild.  Contraction frequency is irregular.   Abdomen: Surgical scars: low transverse.   Estimated fetal weight is AGA.   Fetal presentation: vertex  Introitus: Normal vulva. Vulva is negative for condylomata and lesion.  Normal vagina.  Vagina is negative for condylomata.  Amniotic fluid character: clear.  Pelvis:  adequate for delivery.   Cervix: Cervix evaluated by digital exam.     Fetal Exam Fetal Monitor Review: Baseline rate: 120.  Variability: moderate (6-25 bpm).   Pattern: accelerations present and no decelerations.    Fetal State Assessment: Category I - tracings are normal.     Physical Exam Vitals and nursing note reviewed. Exam conducted with a chaperone present.  Cardiovascular:     Rate and Rhythm: Normal rate.     Pulses: Normal pulses.  Pulmonary:     Effort: Pulmonary effort is normal.  Genitourinary:     General: Normal vulva.  Vulva is no lesion.  Musculoskeletal:        General: Normal range of motion.     Cervical back: Normal range of motion.  Skin:    General: Skin is warm and dry.     Capillary Refill: Capillary refill takes 2 to 3 seconds.  Neurological:     General: No focal deficit present.     Mental Status: She is alert. Mental status is at baseline. She is disoriented.  Psychiatric:        Mood and Affect: Mood normal.        Behavior: Behavior normal.        Thought Content: Thought content normal.        Judgment: Judgment normal.     Prenatal labs: ABO, Rh: --/--/O POS (10/31 0057) Antibody: NEG (10/31 0057) Rubella:   RPR:    HBsAg:    HIV:    GBS:     Assessment/Plan: 24yo G2P0101 at 402/[redacted]wks gestation in active labor -Admit -Sars covid screen -Treat for +GBS with PCN - Expectant mgmt per pt request -Pain control prn - Pt desires repeat c/s if labor stalls   [name] Adrienne Calderon 01/01/2020, 9:04 AM

## 2020-01-01 NOTE — Progress Notes (Signed)
Patient ID: Adrienne Calderon, female   DOB: 05-Jan-1996, 24 y.o.   MRN: [id number] Late entry  Pt reported having fast heart rate and feeling clammy.  On auscultation, HR in SVT.  FHT- cat 1 I was then called away to an imminent delivery.   I ordered a bedside EKG to be done while away - it confirmed exam findings  While still in other delivery, I was informed pt had spontaneously converted back to Sinus rhythm  Pt confirmed feeling better when I returned to see her  SVE 6.5/90/-1 TOCO - ctxs irreg q 4-72mins Still cat 1, 120s  Plan to continue with expectant mgmt.  Will order cardiology consult postpartum or if recurs

## 2020-01-02 ENCOUNTER — Other Ambulatory Visit: Payer: Self-pay | Admitting: Physician Assistant

## 2020-01-02 ENCOUNTER — Inpatient Hospital Stay (HOSPITAL_COMMUNITY)

## 2020-01-02 ENCOUNTER — Encounter: Payer: Self-pay | Admitting: *Deleted

## 2020-01-02 DIAGNOSIS — R9431 Abnormal electrocardiogram [ECG] [EKG]: Secondary | ICD-10-CM | POA: Diagnosis not present

## 2020-01-02 DIAGNOSIS — I471 Supraventricular tachycardia: Secondary | ICD-10-CM

## 2020-01-02 DIAGNOSIS — Z98891 History of uterine scar from previous surgery: Secondary | ICD-10-CM

## 2020-01-02 LAB — CBC
HCT: 27.3 % — ABNORMAL LOW (ref 36.0–46.0)
Hemoglobin: 8.9 g/dL — ABNORMAL LOW (ref 12.0–15.0)
MCH: 29.8 pg (ref 26.0–34.0)
MCHC: 32.6 g/dL (ref 30.0–36.0)
MCV: 91.3 fL (ref 80.0–100.0)
Platelets: 152 10*3/uL (ref 150–400)
RBC: 2.99 MIL/uL — ABNORMAL LOW (ref 3.87–5.11)
RDW: 13.9 % (ref 11.5–15.5)
WBC: 8.3 10*3/uL (ref 4.0–10.5)
nRBC: 0 % (ref 0.0–0.2)

## 2020-01-02 LAB — ECHOCARDIOGRAM COMPLETE
Area-P 1/2: 5.02 cm2
Height: 66 in
S' Lateral: 2.7 cm
Weight: 3364.8 oz

## 2020-01-02 NOTE — Progress Notes (Signed)
Patient ID: Adrienne Calderon, female   DOB: 1995/04/25, 24 y.o.   MRN: [id number] Patient enrolled for 14 day ZIO XT long term holter monitor to be shipped to her home.  Letter with instructions mailed to patient.

## 2020-01-02 NOTE — Progress Notes (Signed)
POSTPARTUM POSTOP PROGRESS NOTE  POD #1  Subjective:  No acute events overnight. Foley is out, has not yet voided or ambulated. She denies nausea or vomiting.  Pain is well controlled.  She has not had flatus. She has not had bowel movement.  Lochia Minimal.   Has not been seen yet by Cards this morning, denies palpitations, SOB, CP at this time. Reviewed intraop course once more regarding thin LUS and extension into broad ligament. All questions answered.   Objective: Blood pressure (!) 103/58, pulse 74, temperature 98.1 F (36.7 C), temperature source Oral, resp. rate 18, height 5\' 6"  (1.676 m), weight 95.4 kg, SpO2 97 %, unknown if currently breastfeeding.  Physical Exam:  General: alert, cooperative and no distress Lochia:normal flow Chest: CTAB Heart: RRR no m/r/g Abdomen: +BS, soft, nontender Uterine Fundus: firm, 2cm below umbilicus. Honeycomb dressing intact, minimal drainage (changed overnight) Extremities: neg edema, neg calf TTP BL, neg Homans BL  UOP 1345 overnight   Recent Labs    01/01/20 0050 01/02/20 0420  HGB 12.3 8.9*  HCT 38.2 27.3*    Assessment/Plan:  ASSESSMENT: Adrienne Calderon is a 24 y.o. 22 s/p PLTCS @ [redacted]w[redacted]d for persistent cat 2 strip remote form delivery and new-onset SVT. PNC c/b h/o csx x1 preterm (breech). Panic d/o (Zoloft), COVID. PP course c/b PPH  PPH - anemia 2/2 acute blood loss - postop H/H 8.9/27.3, VSS stable at this time. Fe/Colace ordered, UOP appropriate, pending ambulation  SVT - s/p Metoprolol 2.5mg  x4 prior to section. Pending Cards eval this AM. Asymptomatic at this time  Breastfeeding and Lactation consult  Desires circ for baby boy, has voided and passed stool   LOS: 1 day

## 2020-01-02 NOTE — Anesthesia Postprocedure Evaluation (Signed)
Anesthesia Post Note  Patient: Adrienne Calderon  Procedure(s) Performed: CESAREAN SECTION (N/A )     Patient location during evaluation: PACU Anesthesia Type: Spinal Level of consciousness: oriented and awake and alert Pain management: pain level controlled Vital Signs Assessment: post-procedure vital signs reviewed and stable Respiratory status: spontaneous breathing, respiratory function stable and patient connected to nasal cannula oxygen Cardiovascular status: blood pressure returned to baseline and stable Postop Assessment: no headache, no backache and no apparent nausea or vomiting Anesthetic complications: no   No complications documented.  Last Vitals:  Vitals:   01/01/20 2100 01/01/20 2200  BP: (!) 96/50 (!) 102/53  Pulse: (!) 56 67  Resp:    Temp:    SpO2:      Last Pain:  Vitals:   01/02/20 0210  TempSrc:   PainSc: 0-No pain   Pain Goal:                   Diaz Crago

## 2020-01-02 NOTE — Progress Notes (Signed)
MOB was referred for history of depression/anxiety. * Referral screened out by Clinical Social Worker because none of the following criteria appear to apply: ~ History of anxiety/depression during this pregnancy, or of post-partum depression following prior delivery. ~ Diagnosis of anxiety and/or depression within last 3 years OR * MOB's symptoms currently being treated with medication and/or therapy. MOB has an active Rx for Zoloft.   Please contact the Clinical Social Worker if needs arise, by MOB request, or if MOB scores greater than 9/yes to question 10 on Edinburgh Postpartum Depression Screen.  Sevrin Sally Boyd-Gilyard, MSW, LCSW Clinical Social Work [phone]  

## 2020-01-02 NOTE — Progress Notes (Signed)
  Echocardiogram 2D Echocardiogram has been performed.  Adrienne Calderon 01/02/2020, 2:53 PM

## 2020-01-02 NOTE — Progress Notes (Signed)
Pt evaluated this evening. Ambulating, voiding, passing flatus without issue. Has been seen by cardiology, recommend outpatient Zio monitor x2wk with f/u. Ordered Lopressor for PRN use if vagal maneuvers ineffective. Patient denies further episodes of palpitations, SOB, CP. Explained inability to do scheduled circumcision 2/2 multiple deliveries throughout the day, will plan for tomorrow

## 2020-01-02 NOTE — Consult Note (Addendum)
Cardiology Consultation:   Patient ID: Adrienne Calderon MRN: [id number]; DOB: 1995-12-23  Admit date: 12/31/2019 Date of Consult: 01/02/2020  Primary Care Provider: Oneita Calderon, No [facility] Cardiologist: Adrienne Constant, MD new [facility] Electrophysiologist:  None    Patient Profile:   Adrienne Calderon is a 24 y.o. female with a hx of anxiety, panic disorder, asthma, and gestational glucose intolerance who is being seen today for the evaluation of SVT prior to C-section at the request of Dr. Mindi Calderon.  History of Present Illness:   [name] with no prior cardiac history presented to [facility] with spontaneous rupture of membranes. She was G2P0101 on presentation. She was admitted and then found to have rapid heart rate. EKG showed SVT, but pt converted back to sinus rhythm via involuntary vagal maneuver during a contraction. Fetal heart rate with decelerations and decision was made to proceed with cesarean section. Rapid response was called for recurrence of SVT. Vagal maneuver no longer working,  Prior to C-section, patient received 2.5 mg IV lopressor x 4. Cardiology was consulted for medication recommendations at discharge.   On my interview, she reports having episodes of rapid heart beat starting at the beginning of her second trimester. She reports episodes that would be triggered by lying in her left side and last about 1 minute. Her OB felt this was related to anxiety and prescribed an SSRI. Pt took SSRI during times of rapid heart beat with no relief. She had episodes about weekly for several weeks, and then experienced a lull in symptoms until her third trimester. During her third trimester, she reports episodes of rapid heart beat would last longer - 7 minutes - and were associated with lightheadedness and dizziness. She denies frank syncope. She has never experienced rapid heart beat prior to this pregnancy and has not had an episode after delivery.    Past Medical History:  Diagnosis Date     Asthma    childhood   Depression    meds working, doing well   Infection    UTI    Past Surgical History:  Procedure Laterality Date   CESAREAN SECTION N/A 06/28/2017   Procedure: CESAREAN SECTION;  Surgeon: Adrienne Mackie, MD;  Location: [facility] BIRTHING SUITES;  Service: Obstetrics;  Laterality: N/A;   WISDOM TOOTH EXTRACTION N/A      Home Medications:  Prior to Admission medications   Medication Sig Start Date End Date Taking? Authorizing Provider  sertraline (ZOLOFT) 25 MG tablet Take 25 mg by mouth daily. 12/02/19   [provider]    Inpatient Medications: Scheduled Meds:  ibuprofen  800 mg Oral Q8H   influenza vac split quadrivalent PF  0.5 mL Intramuscular Tomorrow-1000   prenatal multivitamin  1 tablet Oral Q1200   scopolamine  1 patch Transdermal Once   senna-docusate  2 tablet Oral Q24H   sertraline  25 mg Oral Daily   simethicone  80 mg Oral TID PC   simethicone  80 mg Oral Q24H   Tdap  0.5 mL Intramuscular Once   Continuous Infusions:  naLOXone (NARCAN) adult infusion for PRURITIS     PRN Meds: coconut oil, witch hazel-glycerin **AND** dibucaine, diphenhydrAMINE **OR** diphenhydrAMINE, diphenhydrAMINE, ketorolac **OR** ketorolac, menthol-cetylpyridinium, nalbuphine **OR** nalbuphine, nalbuphine **OR** nalbuphine, naloxone **AND** sodium chloride flush, naLOXone (NARCAN) adult infusion for PRURITIS, ondansetron (ZOFRAN) IV, oxyCODONE, simethicone, zolpidem  Allergies:    Allergies  Allergen Reactions   Benzoyl Peroxide Hives    Social History:   Social History  Socioeconomic History   Marital status: Married    Spouse name: Not on file   Number of children: Not on file   Years of education: Not on file   Highest education level: Not on file  Occupational History   Not on file  Tobacco Use   Smoking status: Never Smoker   Smokeless tobacco: Never Used  Vaping Use   Vaping Use: Never used  Substance and Sexual Activity   Alcohol use: Not  Currently   Drug use: Never   Sexual activity: Yes  Other Topics Concern   Not on file  Social History Narrative   Not on file   Social Determinants of Health   Financial Resource Strain:    Difficulty of Paying Living Expenses: Not on file  Food Insecurity:    Worried About Running Out of Food in the Last Year: Not on file   Ran Out of Food in the Last Year: Not on file  Transportation Needs:    Lack of Transportation (Medical): Not on file   Lack of Transportation (Non-Medical): Not on file  Physical Activity:    Days of Exercise per Week: Not on file   Minutes of Exercise per Session: Not on file  Stress:    Feeling of Stress : Not on file  Social Connections:    Frequency of Communication with Friends and Family: Not on file   Frequency of Social Gatherings with Friends and Family: Not on file   Attends Religious Services: Not on file   Active Member of Clubs or Organizations: Not on file   Attends Banker Meetings: Not on file   Marital Status: Not on file  Intimate Partner Violence:    Fear of Current or Ex-Partner: Not on file   Emotionally Abused: Not on file   Physically Abused: Not on file   Sexually Abused: Not on file    Family History:    Family History  Problem Relation Age of Onset   Diabetes Maternal Grandmother    Healthy Mother    Healthy Father    Rheum arthritis Sister      ROS:  Please see the history of present illness.   All other ROS reviewed and negative.     Physical Exam/Data:   Vitals:   01/02/20 0230 01/02/20 0633 01/02/20 0743 01/02/20 1110  BP: (!) 102/49 (!) 92/48 (!) 103/58 (!) 110/53  Pulse: 71 76 74 83  Resp: 16 17 18 18   Temp:  98 F (36.7 C) 98.1 F (36.7 C) 98.1 F (36.7 C)  TempSrc:  Oral Oral Oral  SpO2: 99% 97% 97% 98%  Weight:      Height:        Intake/Output Summary (Last 24 hours) at 01/02/2020 1305 Last data filed at 01/02/2020 1157 Gross per 24 hour  Intake 3750 ml  Output 3928 ml  Net  -178 ml   Last 3 Weights 12/31/2019 11/03/2019 07/18/2019  Weight (lbs) 210 lb 4.8 oz 198 lb 14.4 oz 195 lb 4.8 oz  Weight (kg) 95.391 kg 90.22 kg 88.587 kg     Body mass index is 33.94 kg/m.  General:  Well nourished, well developed, in no acute distress HEENT: normal Neck: no JVD Vascular: No carotid bruits Cardiac:  normal S1, S2; RRR; no murmur Lungs:  clear to auscultation bilaterally, no wheezing, rhonchi or rales  Abd: wrapped s/p C-section Ext: no edema Musculoskeletal:  No deformities, BUE and BLE strength normal and equal Skin: warm and  dry  Neuro:  CNs 2-12 intact, no focal abnormalities noted Psych:  Normal affect   EKG:  The EKG was personally reviewed and demonstrates:  SVR HR 184 Telemetry:  Telemetry was personally reviewed and demonstrates:  N/A  Relevant CV Studies:  none  Laboratory Data:  High Sensitivity Troponin:  No results for input(s): TROPONINIHS in the last 720 hours.   Chemistry Recent Labs  Lab 01/01/20 0050  NA 133*  K 5.6*  CL 100  CO2 23  GLUCOSE 80  BUN 10  CREATININE 0.68  CALCIUM 9.1  GFRNONAA >60  ANIONGAP 10    Recent Labs  Lab 01/01/20 0050  PROT 5.7*  ALBUMIN 3.0*  AST 29  ALT 9  ALKPHOS 163*  BILITOT 1.3*   Hematology Recent Labs  Lab 01/01/20 0050 01/02/20 0420  WBC 6.6 8.3  RBC 4.17 2.99*  HGB 12.3 8.9*  HCT 38.2 27.3*  MCV 91.6 91.3  MCH 29.5 29.8  MCHC 32.2 32.6  RDW 13.9 13.9  PLT 173 152   BNPNo results for input(s): BNP, PROBNP in the last 168 hours.  DDimer No results for input(s): DDIMER in the last 168 hours.   Radiology/Studies:  No results found.   Assessment and Plan:   Paroxysmal SVT Pt reports episodes of rapid heart rate starting in her second trimester and progressing in length of episode as her pregnancy progressed. She denies history of heart monitor.   Pt presented in spontaneous labor at 40 weeks 2 days with membrane rupture. Labor was complicated by abnormal fetal heart rate  tracings and SVT in the pt. SVT initially responded to involuntary vagal maneuvers during contractions. However, SVT and decelerations persisted prompting need for emergent cesarean section. Pt required 2.5 mg IV lopressor x 4 for SVT occurrences. EKG today with SR without delta wave  Given her history, will obtain echocardiogram. Will provide 25 mg lopressor to be used PRN for fast heart rate if vagal maneuvers have been exhausted. Reviewed vagal maneuvers.   Will place 14-day zio patch, to be mailed to patient.  Will arrange cardiology follow up.       For questions or updates, please contact [facility] Please consult [email or web address] for contact info under    Signed, Marcelino Duster, PA  01/02/2020 1:05 PM  Personally seen and examined. Agree with APP Provider above with the following comments:  24 yo F with no prior history of SVT during pregnancy, no Fhx of SCD or Fhx of SVT who presented to deliver her second child.  During this pregnancy has had symptomatic palpitations since 2nd trimester that she could abate with Valsalva. Had decels peri-delivery and went for C section.  Exam unremarkable; no murmurs and presently in regular rhythm. 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  Es ~ 187.   Will get echo to confirm structurally normal heart would monitor on telemetry while here; and would offer PRN lopressor for SVT and unable to abate with maneuvers.  Would get ZioPatch to assess burden post pregnancy.  Outpatient, will discuss conservative therapy vs EP referral.  Discussed with patient and husband.  Adrienne Constant, MD

## 2020-01-03 ENCOUNTER — Encounter (HOSPITAL_COMMUNITY): Payer: Self-pay | Admitting: Obstetrics and Gynecology

## 2020-01-03 DIAGNOSIS — I471 Supraventricular tachycardia: Secondary | ICD-10-CM | POA: Diagnosis not present

## 2020-01-03 DIAGNOSIS — Z98891 History of uterine scar from previous surgery: Secondary | ICD-10-CM | POA: Diagnosis not present

## 2020-01-03 LAB — SURGICAL PATHOLOGY

## 2020-01-03 NOTE — Progress Notes (Signed)
POD #2 LTCS Doing well, minimal pain Afeb, VSS, no prolonged tachycardia Abd- soft, fundus firm, incision intact  Continue routine care, continue to monitor and treat SVT, will do circ on baby sometime today

## 2020-01-03 NOTE — Procedures (Signed)
Progress Note  Patient Name: Adrienne Calderon Date of Encounter: 01/03/2020  Primary Cardiologist: Christell Constant, MD   Subjective   No events overnight.  No tachycardia.  Patient feels well but tired.  No tachycardia overnight  Inpatient Medications    Scheduled Meds: . ibuprofen  800 mg Oral Q8H  . influenza vac split quadrivalent PF  0.5 mL Intramuscular Tomorrow-1000  . prenatal multivitamin  1 tablet Oral Q1200  . scopolamine  1 patch Transdermal Once  . senna-docusate  2 tablet Oral Q24H  . sertraline  25 mg Oral Daily  . simethicone  80 mg Oral TID PC  . simethicone  80 mg Oral Q24H  . Tdap  0.5 mL Intramuscular Once   Continuous Infusions: . naLOXone (NARCAN) adult infusion for PRURITIS     PRN Meds: coconut oil, witch hazel-glycerin **AND** dibucaine, diphenhydrAMINE **OR** diphenhydrAMINE, diphenhydrAMINE, menthol-cetylpyridinium, nalbuphine **OR** nalbuphine, nalbuphine **OR** nalbuphine, naloxone **AND** sodium chloride flush, naLOXone (NARCAN) adult infusion for PRURITIS, ondansetron (ZOFRAN) IV, oxyCODONE, simethicone, zolpidem   Vital Signs    Vitals:   01/02/20 2000 01/02/20 2332 01/03/20 0607 01/03/20 0739  BP: (!) 106/57 (!) 107/58 120/67 110/65  Pulse: 97 79 85 83  Resp:  18 16   Temp: 98.3 F (36.8 C) 98.5 F (36.9 C) 98.5 F (36.9 C) 97.6 F (36.4 C)  TempSrc: Oral Oral Axillary Oral  SpO2: 100% 98% 100% 98%  Weight:      Height:        Intake/Output Summary (Last 24 hours) at 01/03/2020 0820 Last data filed at 01/02/2020 1408 Gross per 24 hour  Intake --  Output 1450 ml  Net -1450 ml   Filed Weights   12/31/19 2344  Weight: 95.4 kg    Telemetry    No return of SVT - Personally Reviewed  ECG    No new - Personally Reviewed  Physical Exam   GEN: No acute distress.   Neck: No JVD Cardiac: RRR, no murmurs, rubs, or gallops.  Respiratory: Clear to auscultation bilaterally. GI: Soft, nontender, non-distended  MS: No edema;  No deformity. Neuro:  Nonfocal  Psych: Normal affect   Labs    Chemistry Recent Labs  Lab 01/01/20 0050  NA 133*  K 5.6*  CL 100  CO2 23  GLUCOSE 80  BUN 10  CREATININE 0.68  CALCIUM 9.1  PROT 5.7*  ALBUMIN 3.0*  AST 29  ALT 9  ALKPHOS 163*  BILITOT 1.3*  GFRNONAA >60  ANIONGAP 10     Hematology Recent Labs  Lab 01/01/20 0050 01/02/20 0420  WBC 6.6 8.3  RBC 4.17 2.99*  HGB 12.3 8.9*  HCT 38.2 27.3*  MCV 91.6 91.3  MCH 29.5 29.8  MCHC 32.2 32.6  RDW 13.9 13.9  PLT 173 152   Cardiac EnzymesNo results for input(s): TROPONINI in the last 168 hours. No results for input(s): TROPIPOC in the last 168 hours.   BNPNo results for input(s): BNP, PROBNP in the last 168 hours.   DDimer No results for input(s): DDIMER in the last 168 hours.   Radiology    ECHOCARDIOGRAM COMPLETE  Result Date: 01/02/2020    ECHOCARDIOGRAM REPORT   Patient Name:   Adrienne Calderon Date of Exam: 01/02/2020 Medical Rec #:  [id number]   Height:       66.0 in Accession #:    [id number]  Weight:       210.3 lb Date of Birth:  12/30/1995    BSA:  2.043 m Patient Age:    24 years    BP:           110/53 mmHg Patient Gender: F           HR:           83 bpm. Exam Location:  Inpatient Procedure: 2D Echo, Cardiac Doppler and Color Doppler Indications:    Abnormal EKG  History:        Patient has no prior history of Echocardiogram examinations.                 Arrythmias:SVT. SVT prior to C-section.  Sonographer:    Lavenia Atlas Referring Phys: [id number] ANGELA NICOLE DUKE IMPRESSIONS  1. Left ventricular ejection fraction, by estimation, is 60 to 65%. The left ventricle has normal function. The left ventricle has no regional wall motion abnormalities. Left ventricular diastolic parameters were normal.  2. Right ventricular systolic function is normal. The right ventricular size is normal. There is normal pulmonary artery systolic pressure.  3. The mitral valve is normal in structure. Trivial mitral  valve regurgitation.  4. The aortic valve is tricuspid. Aortic valve regurgitation is not visualized. Comparison(s): No prior Echocardiogram. FINDINGS  Left Ventricle: Left ventricular ejection fraction, by estimation, is 60 to 65%. The left ventricle has normal function. The left ventricle has no regional wall motion abnormalities. The left ventricular internal cavity size was normal in size. There is  no left ventricular hypertrophy. Left ventricular diastolic parameters were normal. Right Ventricle: The right ventricular size is normal. Right vetricular wall thickness was not assessed. Right ventricular systolic function is normal. There is normal pulmonary artery systolic pressure. The tricuspid regurgitant velocity is 2.32 m/s, and with an assumed right atrial pressure of 3 mmHg, the estimated right ventricular systolic pressure is 24.5 mmHg. Left Atrium: Left atrial size was normal in size. Right Atrium: Right atrial size was normal in size. Pericardium: There is no evidence of pericardial effusion. Mitral Valve: The mitral valve is normal in structure. Trivial mitral valve regurgitation. Tricuspid Valve: The tricuspid valve is normal in structure. Tricuspid valve regurgitation is trivial. Aortic Valve: The aortic valve is tricuspid. Aortic valve regurgitation is not visualized. Pulmonic Valve: The pulmonic valve was normal in structure. Pulmonic valve regurgitation is trivial. Aorta: The aortic root and ascending aorta are structurally normal, with no evidence of dilitation. IAS/Shunts: No atrial level shunt detected by color flow Doppler.  LEFT VENTRICLE PLAX 2D LVIDd:         4.40 cm  Diastology LVIDs:         2.70 cm  LV e' medial:    11.40 cm/s LV PW:         1.00 cm  LV E/e' medial:  8.3 LV IVS:        0.80 cm  LV e' lateral:   14.10 cm/s LVOT diam:     1.90 cm  LV E/e' lateral: 6.7 LV SV:         53 LV SV Index:   26 LVOT Area:     2.84 cm  RIGHT VENTRICLE RV Basal diam:  3.00 cm RV S prime:     10.40  cm/s TAPSE (M-mode): 2.3 cm LEFT ATRIUM             Index       RIGHT ATRIUM           Index LA diam:        2.90 cm 1.42 cm/m  RA Area:     12.40 cm LA Vol (A2C):   30.2 ml 14.78 ml/m RA Volume:   29.00 ml  14.20 ml/m LA Vol (A4C):   42.1 ml 20.61 ml/m LA Biplane Vol: 35.8 ml 17.52 ml/m  AORTIC VALVE LVOT Vmax:   92.70 cm/s LVOT Vmean:  70.100 cm/s LVOT VTI:    0.187 m  AORTA Ao Root diam: 2.80 cm MITRAL VALVE               TRICUSPID VALVE MV Area (PHT): 5.02 cm    TR Peak grad:   21.5 mmHg MV Decel Time: 151 msec    TR Vmax:        232.00 cm/s MV E velocity: 95.10 cm/s MV A velocity: 48.00 cm/s  SHUNTS MV E/A ratio:  1.98        Systemic VTI:  0.19 m                            Systemic Diam: 1.90 cm Laurance Flatten MD Electronically signed by Laurance Flatten MD Signature Date/Time: 01/02/2020/5:15:45 PM    Final     Cardiac Studies   Personally reviewed echo above:  Normal biventricular function with no valvular pathology  Patient Profile     24 y.o. 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      Signed, Christell Constant, MD  01/03/2020, 8:20 AM

## 2020-01-04 ENCOUNTER — Other Ambulatory Visit (HOSPITAL_COMMUNITY)
Admission: RE | Admit: 2020-01-04 | Discharge: 2020-01-04 | Disposition: A | Discharge status: Home / Self Care | Source: Ambulatory Visit | Attending: Obstetrics and Gynecology | Admitting: Obstetrics and Gynecology

## 2020-01-04 ENCOUNTER — Other Ambulatory Visit (INDEPENDENT_AMBULATORY_CARE_PROVIDER_SITE_OTHER)

## 2020-01-04 ENCOUNTER — Other Ambulatory Visit (HOSPITAL_COMMUNITY)

## 2020-01-04 DIAGNOSIS — I471 Supraventricular tachycardia: Secondary | ICD-10-CM

## 2020-01-04 DIAGNOSIS — Z98891 History of uterine scar from previous surgery: Secondary | ICD-10-CM | POA: Diagnosis not present

## 2020-01-04 MED ORDER — ACETAMINOPHEN 325 MG PO TABS: 650.0000 mg | ORAL_TABLET | Freq: Four times a day (QID) | ORAL | 1 refills | Status: AC | PRN

## 2020-01-04 MED ORDER — SIMETHICONE 80 MG PO CHEW: 80.0000 mg | CHEWABLE_TABLET | Freq: Three times a day (TID) | ORAL | 0 refills | Status: AC

## 2020-01-04 MED ORDER — SENNOSIDES-DOCUSATE SODIUM 8.6-50 MG PO TABS: 2.0000 | ORAL_TABLET | ORAL | 0 refills | Status: AC

## 2020-01-04 MED ORDER — OXYCODONE HCL 5 MG PO TABS
5.0000 mg | ORAL_TABLET | ORAL | 0 refills | Status: DC | PRN
Start: 2020-01-04 — End: 2020-02-02

## 2020-01-04 MED ORDER — METOPROLOL TARTRATE 25 MG PO TABS: ORAL_TABLET | ORAL | 1 refills | Status: DC

## 2020-01-04 MED ORDER — IBUPROFEN 800 MG PO TABS: 800.0000 mg | ORAL_TABLET | Freq: Three times a day (TID) | ORAL | 0 refills | Status: AC

## 2020-01-04 NOTE — Discharge Instructions (Signed)
Before Baby Comes Home Once your baby is home with you, things may become a bit hectic as you map out a schedule around your newborn's patterns. Preparing the things you need at home before that time comes is important. Before your baby arrives, make sure you:  Have all the supplies that you will need to care for your baby.  Know where to go if there is an emergency.  Discuss the baby's arrival with other family members. What supplies will I need? Having the following supplies ready before your baby arrives will help ensure that you are prepared: Large items  Crib or bassinet and mattress. Make sure to follow safe sleep recommendations to reduce the risk of sudden infant death syndrome.  Rear-facing infant car seat. Have a trained professional check to make sure that it is installed in your car correctly. Many hospitals and fire departments perform this service free of charge.  Stroller. Always make sure any products--including cribs, mattresses, bassinets, or portable cribs and play areas--are safe. Check for recalls on your specific brand and model of crib. Breastfeeding  Nursing pillow.  Milk storage containers or bags.  Nipple cream.  Nursing bra.  Breast pads.  Breast pump.  Breast shields. Feeding  Formula.  Purified bottled water.  6-8 bottles (4-5 oz bottles and 8-9 oz bottles).  6-8 bottle nipples.  Bibs and burp cloths.  Bottle brush.  Bottle sterilizer (or a pot with a lid). Bathing  Infant bath basin.  Mild baby soap and baby shampoo.  Soft cloth towel and washcloth.  Hooded towel. Diapering  Diapers. You may need to use as many as 10-12 diapers each day.  Baby wipes.  Diaper cream.  Petroleum jelly.  Changing pad.  Hand sanitizer. Health and safety  Rectal thermometer.  Infant medicines.  Bulb syringe.  Baby nail clippers.  Baby monitor.  2-3 pacifiers, if desired. Sleeping  Sleep sack or swaddling blanket.  Firm  mattress pad and fitted sheets for the crib or bassinet. Other supplies  Diaper bag.  Clothing, including one-piece outfits and pajamas.  Receiving blankets. Follow these instructions at home: Preparing for an emergency Prepare for an emergency by taking these steps:  Know when to seek care or call your health care provider.  Know how to get to the nearest hospital.  List the phone numbers of your baby's health care providers near your home phone and in your cell phone.  Take an infant first aid and CPR class.  Place the phone number for the poison control center on your refrigerator.  If there will be caregivers in the home, make sure your phone number, emergency contacts, and address are placed on the refrigerator in case they need to be given to emergency services. Preparing your family   Create a plan for visitors. Keep your baby away from people who have a cough, fever, or other symptoms of illness.  Prepare freezer meals ahead of time, and ask friends and family to help with meal preparation, errands, and everyday tasks.  If you have other children: ? Talk with them about the baby coming home. Ask them how they feel about it. ? Read a book together about being a new big brother or sister. ? Find ways to let them help you prepare for the new baby. ? Have someone ready to care for them while you are in the hospital. Where to find more information  Consumer Product Safety Commission: www.cpsc.gov  American Academy of Pediatrics: www.healthychildren.org  Safe Kids   Worldwide: www.safekids.org Summary  Planning is important before bringing your baby home from the hospital. You will need to have certain supplies ready before your baby arrives.  You will need to have a rear-facing infant car seat ready prior to bringing your baby home. Have a trained professional check to make sure that it is installed in your car correctly.  Always make sure any products--including  cribs, mattresses, bassinets, or portable cribs and play areas--are safe. Check for recalls on your specific brand and model of crib.  Know when to seek care or call your health care provider, and know how to get to the nearest hospital. This information is not intended to replace advice given to you by your health care provider. Make sure you discuss any questions you have with your health care provider. Document Revised: 01/30/2017 Document Reviewed: 01/07/2017 Elsevier Patient Education  2020 [employer] Postpartum Care After Cesarean Delivery This sheet gives you information about how to care for yourself from the time you deliver your baby to up to 6-12 weeks after delivery (postpartum period). Your health care provider may also give you more specific instructions. If you have problems or questions, contact your health care provider. Follow these instructions at home: Medicines  Take over-the-counter and prescription medicines only as told by your health care provider.  If you were prescribed an antibiotic medicine, take it as told by your health care provider. Do not stop taking the antibiotic even if you start to feel better.  Ask your health care provider if the medicine prescribed to you: ? Requires you to avoid driving or using heavy machinery. ? Can cause constipation. You may need to take actions to prevent or treat constipation, such as:  Drink enough fluid to keep your urine pale yellow.  Take over-the-counter or prescription medicines.  Eat foods that are high in fiber, such as beans, whole grains, and fresh fruits and vegetables.  Limit foods that are high in fat and processed sugars, such as fried or sweet foods. Activity  Gradually return to your normal activities as told by your health care provider.  Avoid activities that take a lot of effort and energy (are strenuous) until approved by your health care provider. Walking at a slow to moderate pace is usually safe.  Ask your health care provider what activities are safe for you. ? Do not lift anything that is heavier than your baby or 10 lb (4.5 kg) as told by your health care provider. ? Do not vacuum, climb stairs, or drive a car for as long as told by your health care provider.  If possible, have someone help you at home until you are able to do your usual activities yourself.  Rest as much as possible. Try to rest or take naps while your baby is sleeping. Vaginal bleeding  It is normal to have vaginal bleeding (lochia) after delivery. Wear a sanitary pad to absorb vaginal bleeding and discharge. ? During the first week after delivery, the amount and appearance of lochia is often similar to a menstrual period. ? Over the next few weeks, it will gradually decrease to a dry, yellow-brown discharge. ? For most women, lochia stops completely by 4-6 weeks after delivery. Vaginal bleeding can vary from woman to woman.  Change your sanitary pads frequently. Watch for any changes in your flow, such as: ? A sudden increase in volume. ? A change in color. ? Large blood clots.  If you pass a blood clot, save it and  call your health care provider to discuss. Do not flush blood clots down the toilet before you get instructions from your health care provider.  Do not use tampons or douches until your health care provider says this is safe.  If you are not breastfeeding, your period should return 6-8 weeks after delivery. If you are breastfeeding, your period may return anytime between 8 weeks after delivery and the time that you stop breastfeeding. Perineal care   If your C-section (Cesarean section) was unplanned, and you were allowed to labor and push before delivery, you may have pain, swelling, and discomfort of the tissue between your vaginal opening and your anus (perineum). You may also have an incision in the tissue (episiotomy) or the tissue may have torn during delivery. Follow these instructions as  told by your health care provider: ? Keep your perineum clean and dry as told by your health care provider. Use medicated pads and pain-relieving sprays and creams as directed. ? If you have an episiotomy or vaginal tear, check the area every day for signs of infection. Check for:  Redness, swelling, or pain.  Fluid or blood.  Warmth.  Pus or a bad smell. ? You may be given a squirt bottle to use instead of wiping to clean the perineum area after you go to the bathroom. As you start healing, you may use the squirt bottle before wiping yourself. Make sure to wipe gently. ? To relieve pain caused by an episiotomy, vaginal tear, or hemorrhoids, try taking a warm sitz bath 2-3 times a day. A sitz bath is a warm water bath that is taken while you are sitting down. The water should only come up to your hips and should cover your buttocks. Breast care  Within the first few days after delivery, your breasts may feel heavy, full, and uncomfortable (breast engorgement). You may also have milk leaking from your breasts. Your health care provider can suggest ways to help relieve breast discomfort. Breast engorgement should go away within a few days.  If you are breastfeeding: ? Wear a bra that supports your breasts and fits you well. ? Keep your nipples clean and dry. Apply creams and ointments as told by your health care provider. ? You may need to use breast pads to absorb milk leakage. ? You may have uterine contractions every time you breastfeed for several weeks after delivery. Uterine contractions help your uterus return to its normal size. ? If you have any problems with breastfeeding, work with your health care provider or a Advertising copywriterlactation consultant.  If you are not breastfeeding: ? Avoid touching your breasts as this can make your breasts produce more milk. ? Wear a well-fitting bra and use cold packs to help with swelling. ? Do not squeeze out (express) milk. This causes you to make more  milk. Intimacy and sexuality  Ask your health care provider when you can engage in sexual activity. This may depend on your: ? Risk of infection. ? Healing rate. ? Comfort and desire to engage in sexual activity.  You are able to get pregnant after delivery, even if you have not had your period. If desired, talk with your health care provider about methods of family planning or birth control (contraception). Lifestyle  Do not use any products that contain nicotine or tobacco, such as cigarettes, e-cigarettes, and chewing tobacco. If you need help quitting, ask your health care provider.  Do not drink alcohol, especially if you are breastfeeding. Eating and drinking  Drink enough fluid to keep your urine pale yellow.  Eat high-fiber foods every day. These may help prevent or relieve constipation. High-fiber foods include: ? Whole grain cereals and breads. ? Brown rice. ? Beans. ? Fresh fruits and vegetables.  Take your prenatal vitamins until your postpartum checkup or until your health care provider tells you it is okay to stop. General instructions  Keep all follow-up visits for you and your baby as told by your health care provider. Most women visit their health care provider for a postpartum checkup within the first 3-6 weeks after delivery. Contact a health care provider if you:  Feel unable to cope with the changes that a new baby brings to your life, and these feelings do not go away.  Feel unusually sad or worried.  Have breasts that are painful, hard, or turn red.  Have a fever.  Have trouble holding urine or keeping urine from leaking.  Have little or no interest in activities you used to enjoy.  Have not breastfed at all and you have not had a menstrual period for 12 weeks after delivery.  Have stopped breastfeeding and you have not had a menstrual period for 12 weeks after you stopped breastfeeding.  Have questions about caring for yourself or your  baby.  Pass a blood clot from your vagina. Get help right away if you:  Have chest pain.  Have difficulty breathing.  Have sudden, severe leg pain.  Have severe pain or cramping in your abdomen.  Bleed from your vagina so much that you fill more than one sanitary pad in one hour. Bleeding should not be heavier than your heaviest period.  Develop a severe headache.  Faint.  Have blurred vision or spots in your vision.  Have a bad-smelling vaginal discharge.  Have thoughts about hurting yourself or your baby. If you ever feel like you may hurt yourself or others, or have thoughts about taking your own life, get help right away. You can go to your nearest emergency department or call:  Your local emergency services (911 in the [place]  A suicide crisis helpline, such as the National Suicide Prevention Lifeline at 5857211924. This is open 24 hours a day. Summary  The period of time from when you deliver your baby to up to 6-12 weeks after delivery is called the postpartum period.  Gradually return to your normal activities as told by your health care provider.  Keep all follow-up visits for you and your baby as told by your health care provider. This information is not intended to replace advice given to you by your health care provider. Make sure you discuss any questions you have with your health care provider. Document Revised: 10/07/2017 Document Reviewed: 10/07/2017 Elsevier Patient Education  2020 [employer]. Postpartum Baby Blues The postpartum period begins right after the birth of a baby. During this time, there is often a lot of joy and excitement. It is also a time of many changes in the life of the parents. No matter how many times a mother gives birth, each child brings new challenges to the family, including different ways of relating to one another. It is common to have feelings of excitement along with confusing changes in moods, emotions, and thoughts. You  may feel happy one minute and sad or stressed the next. These feelings of sadness usually happen in the period right after you have your baby, and they go away within a week or two. This is called the "baby  blues." What are the causes? There is no known cause of baby blues. It is likely caused by a combination of factors. However, changes in hormone levels after childbirth are believed to trigger some of the symptoms. Other factors that can play a role in these mood changes include:  Lack of sleep.  Stressful life events, such as poverty, caring for a loved one, or death of a loved one.  Genetics. What are the signs or symptoms? Symptoms of this condition include:  Brief changes in mood, such as going from extreme happiness to sadness.  Decreased concentration.  Difficulty sleeping.  Crying spells and tearfulness.  Loss of appetite.  Irritability.  Anxiety. If the symptoms of baby blues last for more than 2 weeks or become more severe, you may have postpartum depression. How is this diagnosed? This condition is diagnosed based on an evaluation of your symptoms. There are no medical or lab tests that lead to a diagnosis, but there are various questionnaires that a health care provider may use to identify women with the baby blues or postpartum depression. How is this treated? Treatment is not needed for this condition. The baby blues usually go away on their own in 1-2 weeks. Social support is often all that is needed. You will be encouraged to get adequate sleep and rest. Follow these instructions at home: Lifestyle      Get as much rest as you can. Take a nap when the baby sleeps.  Exercise regularly as told by your health care provider. Some women find yoga and walking to be helpful.  Eat a balanced and nourishing diet. This includes plenty of fruits and vegetables, whole grains, and lean proteins.  Do little things that you enjoy. Have a cup of tea, take a bubble bath,  read your favorite magazine, or listen to your favorite music.  Avoid alcohol.  Ask for help with household chores, cooking, grocery shopping, or running errands. Do not try to do everything yourself. Consider hiring a postpartum doula to help. This is a professional who specializes in providing support to new mothers.  Try not to make any major life changes during pregnancy or right after giving birth. This can add stress. General instructions  Talk to people close to you about how you are feeling. Get support from your partner, family members, friends, or other new moms. You may want to join a support group.  Find ways to cope with stress. This may include: ? Writing your thoughts and feelings in a journal. ? Spending time outside. ? Spending time with people who make you laugh.  Try to stay positive in how you think. Think about the things you are grateful for.  Take over-the-counter and prescription medicines only as told by your health care provider.  Let your health care provider know if you have any concerns.  Keep all postpartum visits as told by your health care provider. This is important. Contact a health care provider if:  Your baby blues do not go away after 2 weeks. Get help right away if:  You have thoughts of taking your own life (suicidal thoughts).  You think you may harm the baby or other people.  You see or hear things that are not there (hallucinations). Summary  After giving birth, you may feel happy one minute and sad or stressed the next. Feelings of sadness that happen right after the baby is born and go away after a week or two are called the "baby blues."  You can manage the baby blues by getting enough rest, eating a healthy diet, exercising, spending time with supportive people, and finding ways to cope with stress.  If feelings of sadness and stress last longer than 2 weeks or get in the way of caring for your baby, talk to your health care  provider. This may mean you have postpartum depression. This information is not intended to replace advice given to you by your health care provider. Make sure you discuss any questions you have with your health care provider. Document Revised: 06/11/2018 Document Reviewed: 04/15/2016 Elsevier Patient Education  2020 [employer].

## 2020-01-04 NOTE — Progress Notes (Signed)
Teaching reviewed and no questions answered

## 2020-01-04 NOTE — Progress Notes (Signed)
Progress Note  Patient Name: Adrienne Calderon Date of Encounter: 01/04/2020  Primary Cardiologist: Christell Constant, MD   Subjective   No events overnight.  No tachycardia overnight  Planned for DC today.  Inpatient Medications    Scheduled Meds:  ibuprofen  800 mg Oral Q8H   prenatal multivitamin  1 tablet Oral Q1200   scopolamine  1 patch Transdermal Once   senna-docusate  2 tablet Oral Q24H   sertraline  25 mg Oral Daily   simethicone  80 mg Oral TID PC   simethicone  80 mg Oral Q24H   Tdap  0.5 mL Intramuscular Once   Continuous Infusions:  naLOXone (NARCAN) adult infusion for PRURITIS     PRN Meds: coconut oil, witch hazel-glycerin **AND** dibucaine, diphenhydrAMINE **OR** diphenhydrAMINE, diphenhydrAMINE, menthol-cetylpyridinium, nalbuphine **OR** nalbuphine, nalbuphine **OR** nalbuphine, naloxone **AND** sodium chloride flush, naLOXone (NARCAN) adult infusion for PRURITIS, ondansetron (ZOFRAN) IV, oxyCODONE, simethicone, zolpidem   Vital Signs    Vitals:   01/03/20 1606 01/03/20 1951 01/04/20 0606 01/04/20 0734  BP: (!) 112/48 104/69 117/76 (!) 107/54  Pulse: 85 90 80 70  Resp: 16 17 18 18   Temp: 98.3 F (36.8 C) 97.6 F (36.4 C) 98 F (36.7 C) 97.6 F (36.4 C)  TempSrc: Oral Oral Oral Oral  SpO2: 99% 99% 99% 100%  Weight:      Height:       No intake or output data in the 24 hours ending 01/04/20 0855 Filed Weights   12/31/19 2344  Weight: 95.4 kg    Telemetry    No return of SVT; NSR- Personally Reviewed  ECG    No new - Personally Reviewed  Physical Exam   GEN: No acute distress.   Neck: No JVD Cardiac: RRR, no murmurs, rubs, or gallops.  Respiratory: Clear to auscultation bilaterally. GI: Soft, nontender, non-distended  MS: No edema; No deformity. Neuro:  Nonfocal  Psych: Normal affect   Labs    Chemistry Recent Labs  Lab 01/01/20 0050  NA 133*  K 5.6*  CL 100  CO2 23  GLUCOSE 80  BUN 10  CREATININE 0.68  CALCIUM 9.1    PROT 5.7*  ALBUMIN 3.0*  AST 29  ALT 9  ALKPHOS 163*  BILITOT 1.3*  GFRNONAA >60  ANIONGAP 10     Hematology Recent Labs  Lab 01/01/20 0050 01/02/20 0420  WBC 6.6 8.3  RBC 4.17 2.99*  HGB 12.3 8.9*  HCT 38.2 27.3*  MCV 91.6 91.3  MCH 29.5 29.8  MCHC 32.2 32.6  RDW 13.9 13.9  PLT 173 152   Cardiac EnzymesNo results for input(s): TROPONINI in the last 168 hours. No results for input(s): TROPIPOC in the last 168 hours.   BNPNo results for input(s): BNP, PROBNP in the last 168 hours.   DDimer No results for input(s): DDIMER in the last 168 hours.   Radiology    ECHOCARDIOGRAM COMPLETE  Result Date: 01/02/2020    ECHOCARDIOGRAM REPORT   Patient Name:   Adrienne Calderon Date of Exam: 01/02/2020 Medical Rec #:  13/03/2019   Height:       66.0 in Accession #:    [id number]  Weight:       210.3 lb Date of Birth:  Apr 19, 1995    BSA:          2.043 m Patient Age:    24 years    BP:           110/53 mmHg Patient  Gender: F           HR:           83 bpm. Exam Location:  Inpatient Procedure: 2D Echo, Cardiac Doppler and Color Doppler Indications:    Abnormal EKG  History:        Patient has no prior history of Echocardiogram examinations.                 Arrythmias:SVT. SVT prior to C-section.  Sonographer:    Lavenia Atlas Referring Phys: [id number] ANGELA NICOLE DUKE IMPRESSIONS  1. Left ventricular ejection fraction, by estimation, is 60 to 65%. The left ventricle has normal function. The left ventricle has no regional wall motion abnormalities. Left ventricular diastolic parameters were normal.  2. Right ventricular systolic function is normal. The right ventricular size is normal. There is normal pulmonary artery systolic pressure.  3. The mitral valve is normal in structure. Trivial mitral valve regurgitation.  4. The aortic valve is tricuspid. Aortic valve regurgitation is not visualized. Comparison(s): No prior Echocardiogram. FINDINGS  Left Ventricle: Left ventricular ejection fraction, by  estimation, is 60 to 65%. The left ventricle has normal function. The left ventricle has no regional wall motion abnormalities. The left ventricular internal cavity size was normal in size. There is  no left ventricular hypertrophy. Left ventricular diastolic parameters were normal. Right Ventricle: The right ventricular size is normal. Right vetricular wall thickness was not assessed. Right ventricular systolic function is normal. There is normal pulmonary artery systolic pressure. The tricuspid regurgitant velocity is 2.32 m/s, and with an assumed right atrial pressure of 3 mmHg, the estimated right ventricular systolic pressure is 24.5 mmHg. Left Atrium: Left atrial size was normal in size. Right Atrium: Right atrial size was normal in size. Pericardium: There is no evidence of pericardial effusion. Mitral Valve: The mitral valve is normal in structure. Trivial mitral valve regurgitation. Tricuspid Valve: The tricuspid valve is normal in structure. Tricuspid valve regurgitation is trivial. Aortic Valve: The aortic valve is tricuspid. Aortic valve regurgitation is not visualized. Pulmonic Valve: The pulmonic valve was normal in structure. Pulmonic valve regurgitation is trivial. Aorta: The aortic root and ascending aorta are structurally normal, with no evidence of dilitation. IAS/Shunts: No atrial level shunt detected by color flow Doppler.  LEFT VENTRICLE PLAX 2D LVIDd:         4.40 cm  Diastology LVIDs:         2.70 cm  LV e' medial:    11.40 cm/s LV PW:         1.00 cm  LV E/e' medial:  8.3 LV IVS:        0.80 cm  LV e' lateral:   14.10 cm/s LVOT diam:     1.90 cm  LV E/e' lateral: 6.7 LV SV:         53 LV SV Index:   26 LVOT Area:     2.84 cm  RIGHT VENTRICLE RV Basal diam:  3.00 cm RV S prime:     10.40 cm/s TAPSE (M-mode): 2.3 cm LEFT ATRIUM             Index       RIGHT ATRIUM           Index LA diam:        2.90 cm 1.42 cm/m  RA Area:     12.40 cm LA Vol (A2C):   30.2 ml 14.78 ml/m RA Volume:   29.00  ml  14.20  ml/m LA Vol (A4C):   42.1 ml 20.61 ml/m LA Biplane Vol: 35.8 ml 17.52 ml/m  AORTIC VALVE LVOT Vmax:   92.70 cm/s LVOT Vmean:  70.100 cm/s LVOT VTI:    0.187 m  AORTA Ao Root diam: 2.80 cm MITRAL VALVE               TRICUSPID VALVE MV Area (PHT): 5.02 cm    TR Peak grad:   21.5 mmHg MV Decel Time: 151 msec    TR Vmax:        232.00 cm/s MV E velocity: 95.10 cm/s MV A velocity: 48.00 cm/s  SHUNTS MV E/A ratio:  1.98        Systemic VTI:  0.19 m                            Systemic Diam: 1.90 cm Laurance Flatten MD Electronically signed by Laurance Flatten MD Signature Date/Time: 01/02/2020/5:15:45 PM    Final     Cardiac Studies   Personally reviewed echo above:  Normal biventricular function with no valvular pathology  Patient Profile     24 y.o. female paroxysmal SVT in the setting of pregnancy  Assessment & Plan    Paroxysmal SVT In the setting of pregnancy With a structurally normal heart and without evidence of accessory pathway on sinus ECG - Can go home with lopressor 25 mg PO PRN (q6hr) for tachycardia - Will get 14-day ziopatch mailed to patient - will see in follow up (lives in [place] but comes to [place] daily) - OK to DC from Cardiology perspective - Gave education on ziopatch use - Will sign off at this time   For questions or updates, please contact [facility] Please consult [email or web address] for contact info under Cardiology/STEMI.      Signed, Christell Constant, MD  01/04/2020, 8:55 AM

## 2020-01-04 NOTE — Progress Notes (Signed)
Subjective: Postpartum Day 3: Cesarean Delivery Patient reports tolerating PO, + flatus and no problems voiding.    Objective: Vital signs in last 24 hours: Temp:  [97.6 F (36.4 C)-98.3 F (36.8 C)] 97.6 F (36.4 C) (11/03 0734) Pulse Rate:  [70-90] 70 (11/03 0734) Resp:  [16-18] 18 (11/03 0734) BP: (103-117)/(48-76) 107/54 (11/03 0734) SpO2:  [99 %-100 %] 100 % (11/03 0734)  Physical Exam:  General: alert and cooperative Lochia: appropriate Uterine Fundus: firm Incision: C/D/I   Recent Labs    01/02/20 0420  HGB 8.9*  HCT 27.3*    Assessment/Plan: Status post Cesarean section. Doing well postoperatively.  No further SVT events, appreciate cardiology following with [place].  Discharge home with standard precautions and return to clinic in 2 weeks and cardiology as directed. [name] 01/04/2020, 10:26 AM

## 2020-01-04 NOTE — Discharge Summary (Signed)
Postpartum Discharge Summary       Patient Name: Adrienne Calderon DOB: March 24, 1995 MRN: [id number]  Date of admission: 12/31/2019 Delivery date:01/01/2020  Delivering provider: [facility]  Date of discharge: 01/04/2020  Admitting diagnosis: Amniotic fluid leaking [O42.90] Intrauterine pregnancy: [redacted]w[redacted]d     Secondary diagnosis:  Active Problems:   Amniotic fluid leaking   Status post repeat low transverse cesarean section   SVT (supraventricular tachycardia) (HCC)  Additional problems: Category 2 tracing    Discharge diagnosis: Term Pregnancy Delivered                                              Post partum procedures:telemetry Augmentation: none Complications: Hemorrhage>1020mL  Hospital course: Onset of Labor With Unplanned C/S   24 y.o. yo [id number] at [redacted]w[redacted]d was admitted in Active Labor on 12/31/2019. Patient had a labor course significant for maternal SVT and Category 2 tracing. The patient went for cesarean section due to Category 2 tracing. Delivery details as follows: Membrane Rupture Time/Date: 2:56 PM ,01/01/2020   Delivery Method:C-Section, Low Transverse  Details of operation can be found in separate operative note. Patient had a  postpartum course significant for telemetry monitoring after had SVT in labor.  She had no recurrences and cardiology consulted and cleared her with normal ECHO.   She is ambulating,tolerating a regular diet, passing flatus, and urinating well.  Patient is discharged home in stable condition 01/04/20.  Newborn Data: Birth date:01/01/2020  Birth time:2:57 PM  Gender:Female  Living status:Living  Apgars:7 ,8  Weight:4074 g   Magnesium Sulfate received: No BMZ received: No Rhophylac:No  Physical exam  Vitals:   01/03/20 1606 01/03/20 1951 01/04/20 0606 01/04/20 0734  BP: (!) 112/48 104/69 117/76 (!) 107/54  Pulse: 85 90 80 70  Resp: 16 17 18 18   Temp: 98.3 F (36.8 C) 97.6 F (36.4 C) 98 F (36.7 C) 97.6 F (36.4 C)   TempSrc: Oral Oral Oral Oral  SpO2: 99% 99% 99% 100%  Weight:      Height:       General: alert and cooperative Lochia: appropriate Uterine Fundus: firm Incision: Dressing is clean, dry, and intact DVT Evaluation: No evidence of DVT seen on physical exam. Labs: Lab Results  Component Value Date   WBC 8.3 01/02/2020   HGB 8.9 (L) 01/02/2020   HCT 27.3 (L) 01/02/2020   MCV 91.3 01/02/2020   PLT 152 01/02/2020   CMP Latest Ref Rng & Units 01/01/2020  Glucose 70 - 99 mg/dL 80  BUN 6 - 20 mg/dL 10  Creatinine 01/03/2020 - 4.40 mg/dL 3.47  Sodium 4.25 - 956 mmol/L 133(L)  Potassium 3.5 - 5.1 mmol/L 5.6(H)  Chloride 98 - 111 mmol/L 100  CO2 22 - 32 mmol/L 23  Calcium 8.9 - 10.3 mg/dL 9.1  Total Protein 6.5 - 8.1 g/dL 387)  Total Bilirubin 0.3 - 1.2 mg/dL 5.6(E)  Alkaline Phos 38 - 126 U/L 163(H)  AST 15 - 41 U/L 29  ALT 0 - 44 U/L 9   Edinburgh Score: Edinburgh Postnatal Depression Scale Screening Tool 01/01/2020  I have been able to laugh and see the funny side of things. 0  I have looked forward with enjoyment to things. 0  I have blamed myself unnecessarily when things went wrong. 1  I have been anxious or worried for no good  reason. 1  I have felt scared or panicky for no good reason. 1  Things have been getting on top of me. 1  I have been so unhappy that I have had difficulty sleeping. 0  I have felt sad or miserable. 1  I have been so unhappy that I have been crying. 0  The thought of harming myself has occurred to me. 0  Edinburgh Postnatal Depression Scale Total 5     After visit meds:  Allergies as of 01/04/2020      Reactions   Benzoyl Peroxide Hives      Medication List    TAKE these medications   acetaminophen 325 MG tablet Commonly known as: Tylenol Take 2 tablets (650 mg total) by mouth every 6 (six) hours as needed.   ibuprofen 800 MG tablet Commonly known as: ADVIL Take 1 tablet (800 mg total) by mouth every 8 (eight) hours.   metoprolol  tartrate 25 MG tablet Commonly known as: LOPRESSOR Take one tablet by mouth as needed for fast heart rate BID   oxyCODONE 5 MG immediate release tablet Commonly known as: Oxy IR/ROXICODONE Take 1-2 tablets (5-10 mg total) by mouth every 4 (four) hours as needed for moderate pain.   senna-docusate 8.6-50 MG tablet Commonly known as: Senokot-S Take 2 tablets by mouth daily. Start taking on: January 05, 2020   sertraline 25 MG tablet Commonly known as: ZOLOFT Take 25 mg by mouth daily.   simethicone 80 MG chewable tablet Commonly known as: MYLICON Chew 1 tablet (80 mg total) by mouth 3 (three) times daily after meals.        Discharge home in stable condition Infant Feeding: Breast Infant Disposition:home with mother Discharge instruction: per After Visit Summary and Postpartum booklet. Activity: Advance as tolerated. Pelvic rest for 6 weeks.  Diet: routine diet Future Appointments:No future appointments. Follow up Visit:  Follow-up Information    Chandrasekhar, Mahesh A, MD Follow up in 5 week(s).   Specialty: Cardiology Contact information: [address] [place] [address] [phone]        Pryor Ochoa [place], DO. Schedule an appointment as soon as possible for a visit in 2 week(s).   Specialty: Obstetrics and Gynecology Why: Incision check Contact information: [address] [place] [address] [phone]                Please schedule this patient for a In person postpartum visit in 2 weeks with the following provider: MD. Additional Postpartum F/U:Cardiology   Delivery mode:  C-Section, Low Transverse  Anticipated Birth Control:  Unsure   01/04/2020 [name], MD

## 2020-01-06 ENCOUNTER — Inpatient Hospital Stay (HOSPITAL_COMMUNITY): Admit: 2020-01-06 | Admitting: Obstetrics and Gynecology

## 2020-01-10 ENCOUNTER — Telehealth: Payer: Self-pay | Admitting: Internal Medicine

## 2020-01-10 NOTE — Telephone Encounter (Signed)
New message:      Patient calling stating that some one called her concering her monitor.

## 2020-01-10 NOTE — Telephone Encounter (Signed)
The patient reports someone from [facility] called and told her to call back to arrange an outpatient visit. The patient applied her monitor almost a week ago. Scheduled her for visit with Dr. Izora Ribas 12/2 to review results. She was grateful for call and agrees with plan.

## 2020-02-02 ENCOUNTER — Ambulatory Visit (INDEPENDENT_AMBULATORY_CARE_PROVIDER_SITE_OTHER): Admitting: Internal Medicine

## 2020-02-02 ENCOUNTER — Other Ambulatory Visit: Payer: Self-pay

## 2020-02-02 ENCOUNTER — Encounter: Payer: Self-pay | Admitting: Internal Medicine

## 2020-02-02 VITALS — BP 110/70 | HR 90 | Ht 66.0 in | Wt 188.0 lb

## 2020-02-02 DIAGNOSIS — I471 Supraventricular tachycardia: Secondary | ICD-10-CM | POA: Diagnosis not present

## 2020-02-02 MED ORDER — METOPROLOL TARTRATE 25 MG PO TABS: ORAL_TABLET | ORAL | 1 refills | Status: AC

## 2020-02-02 NOTE — Progress Notes (Signed)
Cardiology Office Note:    Date:  02/02/2020   ID:  Adrienne Calderon, DOB May 18, 1995, MRN [id number]  PCP:  Pcp, No  [facility] Cardiologist:  Christell Constant, MD  [facility] Electrophysiologist:  None   Referring MD: No ref. provider found   CC:  Follow up SVT  History of Present Illness:    Adrienne Calderon is a 24 y.o. female with a hx of SVT at the time of delivery (01/01/20) without significant occurrence and with a structurally normally heart who presents for post hospitalization f/u.  Patient notes that she is feeling well.  Note only one episode or palpitations that occurred one hour after coming home from the hospital; this aborted with metoprolol X1.  Patient denies triggering her device.   No syncope or near syncope .  No chest pain or chest pressure.  Past Medical History:  Diagnosis Date  . Asthma    childhood  . Depression    meds working, doing well  . Infection    UTI    Past Surgical History:  Procedure Laterality Date  . CESAREAN SECTION N/A 06/28/2017   Procedure: CESAREAN SECTION;  Surgeon: Olivia Mackie, MD;  Location: [facility] BIRTHING SUITES;  Service: Obstetrics;  Laterality: N/A;  . CESAREAN SECTION N/A 01/01/2020   Procedure: CESAREAN SECTION;  Surgeon: Edwinna Areola, DO;  Location: [facility] LD ORS;  Service: Obstetrics;  Laterality: N/A;  Request RNFA  . WISDOM TOOTH EXTRACTION N/A     Current Medications: Current Meds  Medication Sig  . acetaminophen (TYLENOL) 325 MG tablet Take 2 tablets (650 mg total) by mouth every 6 (six) hours as needed.  Marland Kitchen ibuprofen (ADVIL) 800 MG tablet Take 1 tablet (800 mg total) by mouth every 8 (eight) hours. (Patient taking differently: Take 800 mg by mouth as needed. )  . metoprolol tartrate (LOPRESSOR) 25 MG tablet Take one tablet by mouth as needed for fast heart rate BID  . Prenatal Vit-Fe Fumarate-FA (MULTIVITAMIN-PRENATAL) 27-0.8 MG TABS tablet Take 1 tablet by mouth daily at 12 noon.  . senna-docusate  (SENOKOT-S) 8.6-50 MG tablet Take 2 tablets by mouth daily.  . sertraline (ZOLOFT) 25 MG tablet Take 25 mg by mouth daily.  . simethicone (MYLICON) 80 MG chewable tablet Chew 1 tablet (80 mg total) by mouth 3 (three) times daily after meals. (Patient taking differently: Chew 80 mg by mouth as needed. )  . [DISCONTINUED] metoprolol tartrate (LOPRESSOR) 25 MG tablet Take one tablet by mouth as needed for fast heart rate BID     Allergies:   Benzoyl peroxide   Social History   Socioeconomic History  . Marital status: Married    Spouse name: Not on file  . Number of children: Not on file  . Years of education: Not on file  . Highest education level: Not on file  Occupational History  . Not on file  Tobacco Use  . Smoking status: Never Smoker  . Smokeless tobacco: Never Used  Vaping Use  . Vaping Use: Never used  Substance and Sexual Activity  . Alcohol use: Not Currently  . Drug use: Never  . Sexual activity: Yes  Other Topics Concern  . Not on file  Social History Narrative  . Not on file   Social Determinants of Health   Financial Resource Strain:   . Difficulty of Paying Living Expenses: Not on file  Food Insecurity:   . Worried About Programme researcher, broadcasting/film/video in the Last Year: Not on  file  . Ran Out of Food in the Last Year: Not on file  Transportation Needs:   . Lack of Transportation (Medical): Not on file  . Lack of Transportation (Non-Medical): Not on file  Physical Activity:   . Days of Exercise per Week: Not on file  . Minutes of Exercise per Session: Not on file  Stress:   . Feeling of Stress : Not on file  Social Connections:   . Frequency of Communication with Friends and Family: Not on file  . Frequency of Social Gatherings with Friends and Family: Not on file  . Attends Religious Services: Not on file  . Active Member of Clubs or Organizations: Not on file  . Attends Banker Meetings: Not on file  . Marital Status: Not on file     Family  History: The patient's family history includes Diabetes in her maternal grandmother; Healthy in her father and mother; Rheum arthritis in her sister.  ROS:   Please see the history of present illness.     All other systems reviewed and are negative.  EKGs/Labs/Other Studies Reviewed:    The following studies were reviewed today:  EKG:   01/01/20 SVT rate 184 with ST depressions 01/02/20 sinus low voltage 77  Long Term Heart Monitor 02/02/2020  Patient had a minimum heart rate of 45 bpm, maximum heart rate of 171 bpm, and average heart rate of 83 bpm.  Predominant underlying rhythm was sinus rhythm.  Four runs of supraventricular tachycardia occurred lasting 5 beats at longest with a max rate of 171 bpm.  Isolated PACs were rare (<1.0%), with rare couplets or triplets present.  Isolated PVCs were rare (<1.0%).  No evidence of complete heart block.  Triggered and diary events associated with sinus rhythm. No malignant arrhythmias. - Note patient's child pressed the monitor once; she was asymptomatic.  Echocardiogram 01/02/20:  Normal biventricular function 1. Left ventricular ejection fraction, by estimation, is 60 to 65%. The  left ventricle has normal function. The left ventricle has no regional  wall motion abnormalities. Left ventricular diastolic parameters were  normal.  2. Right ventricular systolic function is normal. The right ventricular  size is normal. There is normal pulmonary artery systolic pressure.  3. The mitral valve is normal in structure. Trivial mitral valve  regurgitation.  4. The aortic valve is tricuspid. Aortic valve regurgitation is not  visualized.   Recent Labs: 01/01/2020: ALT 9; BUN 10; Creatinine, Ser 0.68; Magnesium 2.4; Potassium 5.6; Sodium 133 01/02/2020: Hemoglobin 8.9; Platelets 152  Recent Lipid Panel No results found for: CHOL, TRIG, HDL, CHOLHDL, VLDL, LDLCALC, LDLDIRECT  Risk Assessment/Calculations:     None  Physical  Exam:    VS:  BP 110/70   Pulse 90   Ht 5\' 6"  (1.676 m)   Wt 188 lb (85.3 kg)   SpO2 97%   BMI 30.34 kg/m     Wt Readings from Last 3 Encounters:  02/02/20 188 lb (85.3 kg)  12/31/19 210 lb 4.8 oz (95.4 kg)  11/03/19 198 lb 14.4 oz (90.2 kg)     GEN: Well nourished, well developed in no acute distress HEENT: Normal NECK: No JVD; No carotid bruits LYMPHATICS: No lymphadenopathy CARDIAC: RRR, no murmurs, rubs, gallops RESPIRATORY:  Clear to auscultation without rales, wheezing or rhonchi  ABDOMEN: Soft, non-tender, non-distended MUSCULOSKELETAL:  No edema; No deformity  SKIN: Warm and dry NEUROLOGIC:  Alert and oriented x 3 PSYCHIATRIC:  Normal affect   ASSESSMENT:  1. SVT (supraventricular tachycardia) (HCC)    PLAN:    In order of problems listed above:  SVT - AV Nodal Therapy: continue 25 mg metoprolol tartrate BID PRN Tachycardia - AAD: Will defer this time - EP evaluation: Will defer at this time  - discussed alcohol, exercise    ~ 10 month follow up unless new symptoms or abnormal test results warranting change in plan  Would be best for  Virtual Follow up Would be reasonable for  APP Follow up    Shared Decision Making/Informed Consent        Medication Adjustments/Labs and Tests Ordered: Current medicines are reviewed at length with the patient today.  Concerns regarding medicines are outlined above.  No orders of the defined types were placed in this encounter.  Meds ordered this encounter  Medications  . metoprolol tartrate (LOPRESSOR) 25 MG tablet    Sig: Take one tablet by mouth as needed for fast heart rate BID    Dispense:  30 tablet    Refill:  1    Patient Instructions  Medication Instructions:   Your physician recommends that you continue on your current medications as directed. Please refer to the Current Medication list given to you today.  *If you need a refill on your cardiac medications before your next appointment, please  call your pharmacy*   Lab Work: NONE ORDERED  TODAY   If you have labs (blood work) drawn today and your tests are completely normal, you will receive your results only by: Marland Kitchen MyChart Message (if you have MyChart) OR . A paper copy in the mail If you have any lab test that is abnormal or we need to change your treatment, we will call you to review the results.   Testing/Procedures: NONE ORDERED  TODAY    Follow-Up: At [facility], you and your health needs are our priority.  As part of our continuing mission to provide you with exceptional heart care, we have created designated Provider Care Teams.  These Care Teams include your primary Cardiologist (physician) and Advanced Practice Providers (APPs -  Physician Assistants and Nurse Practitioners) who all work together to provide you with the care you need, when you need it.  We recommend signing up for the patient portal called "MyChart".  Sign up information is provided on this After Visit Summary.  MyChart is used to connect with patients for Virtual Visits (Telemedicine).  Patients are able to view lab/test results, encounter notes, upcoming appointments, etc.  Non-urgent messages can be sent to your provider as well.   To learn more about what you can do with MyChart, go to [email or web address].    Your next appointment:   10 month(s)  The format for your next appointment:    Virtual   Provider:   You may see Christell Constant, MD or one of the following Advanced Practice Providers on your designated Care Team:    Ronie Spies, PA-C  Jacolyn Reedy, PA-C    Other Instructions      Signed, Christell Constant, MD  02/02/2020 4:31 PM    [facility]

## 2020-02-02 NOTE — Patient Instructions (Addendum)
Medication Instructions:   Your physician recommends that you continue on your current medications as directed. Please refer to the Current Medication list given to you today.  *If you need a refill on your cardiac medications before your next appointment, please call your pharmacy*   Lab Work: NONE ORDERED  TODAY   If you have labs (blood work) drawn today and your tests are completely normal, you will receive your results only by: Marland Kitchen MyChart Message (if you have MyChart) OR . A paper copy in the mail If you have any lab test that is abnormal or we need to change your treatment, we will call you to review the results.   Testing/Procedures: NONE ORDERED  TODAY    Follow-Up: At [facility], you and your health needs are our priority.  As part of our continuing mission to provide you with exceptional heart care, we have created designated Provider Care Teams.  These Care Teams include your primary Cardiologist (physician) and Advanced Practice Providers (APPs -  Physician Assistants and Nurse Practitioners) who all work together to provide you with the care you need, when you need it.  We recommend signing up for the patient portal called "MyChart".  Sign up information is provided on this After Visit Summary.  MyChart is used to connect with patients for Virtual Visits (Telemedicine).  Patients are able to view lab/test results, encounter notes, upcoming appointments, etc.  Non-urgent messages can be sent to your provider as well.   To learn more about what you can do with MyChart, go to [email or web address].    Your next appointment:   10 month(s)  The format for your next appointment:    Virtual   Provider:   You may see Christell Constant, MD or one of the following Advanced Practice Providers on your designated Care Team:    Ronie Spies, PA-C  Jacolyn Reedy, PA-C    Other Instructions

## 2020-03-05 ENCOUNTER — Other Ambulatory Visit: Payer: Self-pay | Admitting: Internal Medicine

## 2022-04-22 IMAGING — DX DG CHEST 1V PORT SAME DAY
1 series · 1 of 1 positions shown · non-contrast
Comparison: None.

CLINICAL DATA: Shortness of breath, PGW3J-D6 positive

EXAM:
PORTABLE CHEST 1 VIEW

[chest]
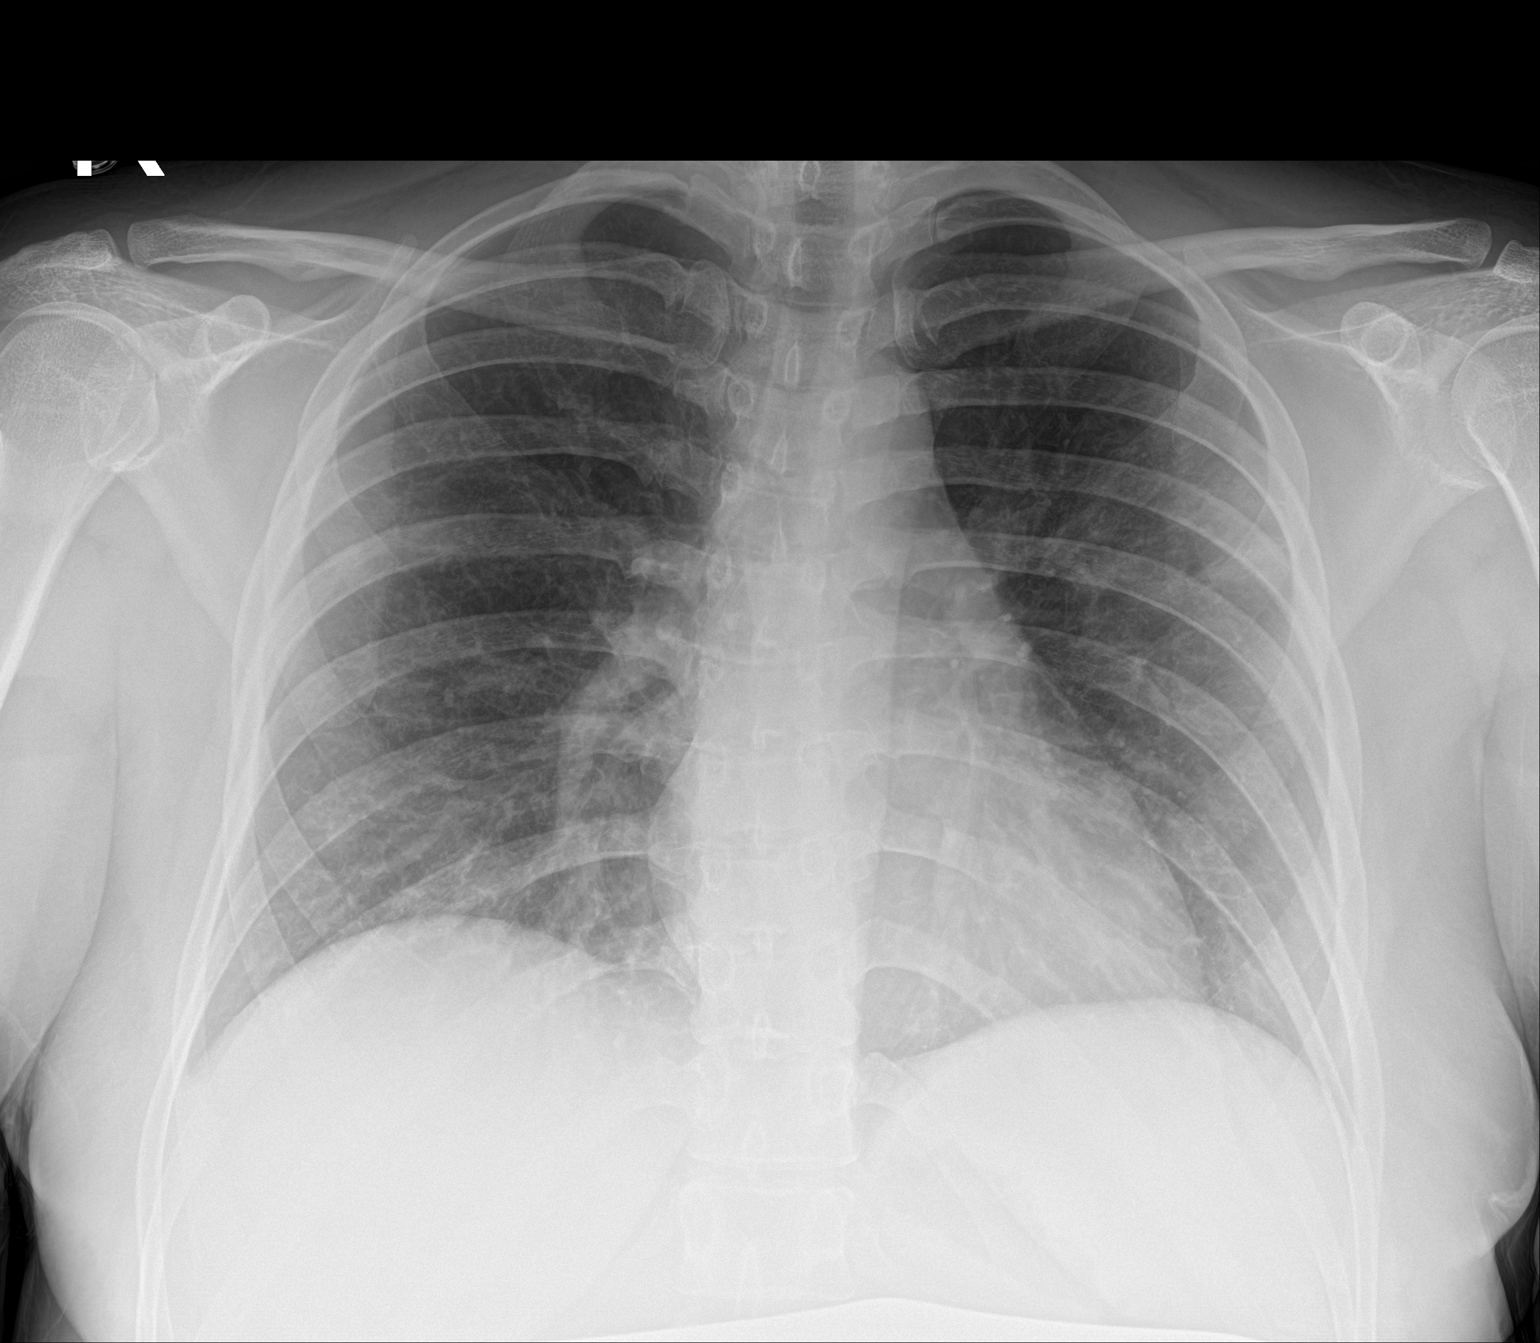

[1 of 1 positions shown; findings below may reference images not displayed]

FINDINGS: Patchy basilar and peripheral opacities in both lungs. No
pneumothorax or effusion. Normally distributed pulmonary vascularity
without evidence of edema. The cardiomediastinal contours are
unremarkable. No acute osseous or soft tissue abnormality.
IMPRESSION: Opacities compatible with multifocal pneumonia in the setting of
PGW3J-D6 positivity.
